# Patient Record
Sex: Male | Born: 1955 | Race: Black or African American | Hispanic: No | Marital: Married | Smoking: Never smoker
Health system: Southern US, Community
[De-identification: ages and names within clinical notes are randomized; demographics above are authoritative.]

## PROBLEM LIST (undated history)

## (undated) DIAGNOSIS — T300 Burn of unspecified body region, unspecified degree: Secondary | ICD-10-CM

## (undated) DIAGNOSIS — E785 Hyperlipidemia, unspecified: Secondary | ICD-10-CM

## (undated) DIAGNOSIS — M109 Gout, unspecified: Secondary | ICD-10-CM

## (undated) DIAGNOSIS — I10 Essential (primary) hypertension: Secondary | ICD-10-CM

## (undated) HISTORY — DX: Hyperlipidemia, unspecified: E78.5

## (undated) HISTORY — DX: Essential (primary) hypertension: I10

## (undated) HISTORY — DX: Burn of unspecified body region, unspecified degree: T30.0

## (undated) HISTORY — DX: Gout, unspecified: M10.9

## (undated) HISTORY — PX: NO PAST SURGERIES: SHX2092

---

## 2001-07-16 ENCOUNTER — Emergency Department (HOSPITAL_COMMUNITY): Admission: EM | Admit: 2001-07-16 | Discharge: 2001-07-17 | Payer: Self-pay | Admitting: Emergency Medicine

## 2009-05-29 LAB — HM COLONOSCOPY

## 2012-01-16 ENCOUNTER — Ambulatory Visit (INDEPENDENT_AMBULATORY_CARE_PROVIDER_SITE_OTHER): Payer: PRIVATE HEALTH INSURANCE | Admitting: Family Medicine

## 2012-01-16 DIAGNOSIS — Z2839 Other underimmunization status: Secondary | ICD-10-CM

## 2012-01-16 DIAGNOSIS — Z0000 Encounter for general adult medical examination without abnormal findings: Secondary | ICD-10-CM

## 2012-01-16 DIAGNOSIS — Z283 Underimmunization status: Secondary | ICD-10-CM

## 2012-01-16 DIAGNOSIS — Z23 Encounter for immunization: Secondary | ICD-10-CM

## 2012-01-16 NOTE — Progress Notes (Signed)
  Subjective:    Patient ID: Brandon Sandoval, male    DOB: [birth year], 56 y.o.   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  H/O HTN and HLD.  Well-controlled.  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   No concerns.  Doing well.  Declines bloodwork today.  Due in next 2 months.    Review of Systems Negative except as per HPI     Objective:   Physical Exam  Constitutional: Vital signs are normal. He appears well-developed and well-nourished. He is active.  HENT:  Head: Normocephalic.  Right Ear: Hearing, tympanic membrane, external ear and ear canal normal.  Left Ear: Hearing, tympanic membrane, external ear and ear canal normal.  Nose: Nose normal.  Mouth/Throat: Uvula is midline, oropharynx is clear and moist and mucous membranes are normal.  Eyes: Conjunctivae and EOM are normal. Pupils are equal, round, and reactive to light.  Neck: No mass and no thyromegaly present.  Cardiovascular: Normal rate, regular rhythm, normal heart sounds, intact distal pulses and normal pulses.   Pulmonary/Chest: Effort normal and breath sounds normal.  Abdominal: Soft. Normal appearance and bowel sounds are normal. There is no hepatosplenomegaly. There is no tenderness. There is no CVA tenderness. No hernia. Hernia confirmed negative in the right inguinal area and confirmed negative in the left inguinal area.  Genitourinary: Testes normal and penis normal.  Lymphadenopathy:    He has no cervical adenopathy.    He has no axillary adenopathy.  Neurological: He is alert.          Assessment & Plan:  PE - normal.  Forms completed.  tdap updated.

## 2012-01-30 ENCOUNTER — Other Ambulatory Visit: Payer: Self-pay | Admitting: Family Medicine

## 2012-01-30 MED ORDER — LISINOPRIL-HYDROCHLOROTHIAZIDE 20-12.5 MG PO TABS: 1.0000 | ORAL_TABLET | Freq: Every day | ORAL | Status: DC

## 2012-02-28 ENCOUNTER — Other Ambulatory Visit: Payer: Self-pay | Admitting: Internal Medicine

## 2012-03-02 ENCOUNTER — Ambulatory Visit (INDEPENDENT_AMBULATORY_CARE_PROVIDER_SITE_OTHER): Payer: PRIVATE HEALTH INSURANCE | Admitting: Physician Assistant

## 2012-03-02 VITALS — BP 119/76 | HR 76 | Temp 98.4°F | Resp 16 | Ht 71.5 in | Wt 230.2 lb

## 2012-03-02 DIAGNOSIS — I10 Essential (primary) hypertension: Secondary | ICD-10-CM | POA: Insufficient documentation

## 2012-03-02 DIAGNOSIS — E7800 Pure hypercholesterolemia, unspecified: Secondary | ICD-10-CM

## 2012-03-02 DIAGNOSIS — E782 Mixed hyperlipidemia: Secondary | ICD-10-CM | POA: Insufficient documentation

## 2012-03-02 LAB — LIPID PANEL
Cholesterol: 187 mg/dL (ref 0–200)
Triglycerides: 154 mg/dL — ABNORMAL HIGH (ref <150)

## 2012-03-02 LAB — COMPREHENSIVE METABOLIC PANEL
ALT: 28 U/L (ref 0–53)
Albumin: 4.8 g/dL (ref 3.5–5.2)
CO2: 27 mEq/L (ref 19–32)
Chloride: 105 mEq/L (ref 96–112)
Glucose, Bld: 93 mg/dL (ref 70–99)
Potassium: 4.1 mEq/L (ref 3.5–5.3)
Sodium: 141 mEq/L (ref 135–145)
Total Protein: 8 g/dL (ref 6.0–8.3)

## 2012-03-02 MED ORDER — PRAVASTATIN SODIUM 40 MG PO TABS: 40.0000 mg | ORAL_TABLET | Freq: Every day | ORAL | Status: DC

## 2012-03-02 MED ORDER — LISINOPRIL-HYDROCHLOROTHIAZIDE 20-12.5 MG PO TABS: 1.0000 | ORAL_TABLET | Freq: Every day | ORAL | Status: DC

## 2012-03-02 NOTE — Progress Notes (Signed)
  Subjective:    Patient ID: Brandon Sandoval, male    DOB: 1956-03-02, 56 y.o.   MRN: [id number]  HPI  Pt presents to clinic for medication refill.  He needs his BP and cholesterol medications.  He does not check his BP at home.  He feels good, no CP no SOB.  No problems with his medications.  Review of Systems  Respiratory: Negative for shortness of breath.   Cardiovascular: Negative for chest pain.  Gastrointestinal: Negative for nausea.  Musculoskeletal:       No LE edema.        Objective:   Physical Exam  Constitutional: He is oriented to person, place, and time. He appears well-developed and well-nourished.  HENT:  Head: Normocephalic and atraumatic.  Right Ear: External ear normal.  Left Ear: External ear normal.  Eyes: Conjunctivae are normal.  Cardiovascular: Normal rate, regular rhythm and normal heart sounds.   Pulmonary/Chest: Effort normal and breath sounds normal.  Musculoskeletal: Normal range of motion.  Neurological: He is alert and oriented to person, place, and time.  Skin: Skin is warm and dry.  Psychiatric: He has a normal mood and affect. His behavior is normal. Judgment and thought content normal.          Assessment & Plan:   1. HTN (hypertension)  Comprehensive metabolic panel, Lipid panel, lisinopril-hydrochlorothiazide (PRINZIDE,ZESTORETIC) 20-12.5 MG per tablet  2. Hypercholesteremia  Comprehensive metabolic panel, Lipid panel, pravastatin (PRAVACHOL) 40 MG tablet   Will plan on recheck in 6 months unless cholesterol labs return abnl.  Sent in medications for 6 months.

## 2012-08-31 ENCOUNTER — Other Ambulatory Visit: Payer: Self-pay | Admitting: Physician Assistant

## 2012-09-28 ENCOUNTER — Other Ambulatory Visit: Payer: Self-pay | Admitting: Physician Assistant

## 2012-10-01 ENCOUNTER — Ambulatory Visit (INDEPENDENT_AMBULATORY_CARE_PROVIDER_SITE_OTHER): Payer: PRIVATE HEALTH INSURANCE | Admitting: Family Medicine

## 2012-10-01 VITALS — BP 138/84 | HR 95 | Temp 99.4°F | Resp 17 | Ht 70.5 in | Wt 231.0 lb

## 2012-10-01 DIAGNOSIS — I10 Essential (primary) hypertension: Secondary | ICD-10-CM

## 2012-10-01 DIAGNOSIS — E785 Hyperlipidemia, unspecified: Secondary | ICD-10-CM

## 2012-10-01 LAB — COMPREHENSIVE METABOLIC PANEL WITH GFR
ALT: 20 U/L (ref 0–53)
AST: 16 U/L (ref 0–37)
Albumin: 3.9 g/dL (ref 3.5–5.2)
CO2: 28 meq/L (ref 19–32)
Calcium: 9.5 mg/dL (ref 8.4–10.5)
Chloride: 106 meq/L (ref 96–112)
Creat: 1.38 mg/dL — ABNORMAL HIGH (ref 0.50–1.35)
Potassium: 4.1 meq/L (ref 3.5–5.3)
Sodium: 143 meq/L (ref 135–145)
Total Protein: 7.5 g/dL (ref 6.0–8.3)

## 2012-10-01 LAB — COMPREHENSIVE METABOLIC PANEL
Alkaline Phosphatase: 68 U/L (ref 39–117)
BUN: 12 mg/dL (ref 6–23)
Glucose, Bld: 80 mg/dL (ref 70–99)
Total Bilirubin: 0.4 mg/dL (ref 0.3–1.2)

## 2012-10-01 LAB — TSH: TSH: 0.522 u[IU]/mL (ref 0.350–4.500)

## 2012-10-01 MED ORDER — LISINOPRIL-HYDROCHLOROTHIAZIDE 20-12.5 MG PO TABS: 1.0000 | ORAL_TABLET | Freq: Every day | ORAL | Status: DC

## 2012-10-01 MED ORDER — PRAVASTATIN SODIUM 40 MG PO TABS: 40.0000 mg | ORAL_TABLET | Freq: Every evening | ORAL | Status: DC

## 2012-10-01 NOTE — Progress Notes (Signed)
[facility]:  Office Visit  Chief Complaint:  Chief Complaint  Patient presents with  . Medication Refill    HPI: Brandon Sandoval is a 56 y.o. male who complains of : 1. HTN-takes meds regular. No SEs. Does not measure BP at home.  2. Hyperlipidemia-takes meds regular. No Ses 3. Vitamin B12 and D deficiency?  OTC meds. Wants to know if he needs to take them.  Past Medical History  Diagnosis Date  . Hyperlipidemia   . Hypertension    History reviewed. No pertinent past surgical history. History   Social History  . Marital Status: Married    Spouse Name: N/A    Number of Children: N/A  . Years of Education: N/A   Social History Main Topics  . Smoking status: Never Smoker   . Smokeless tobacco: None  . Alcohol Use: No  . Drug Use: No  . Sexually Active: None   Other Topics Concern  . None   Social History Narrative  . None   Family History  Problem Relation Age of Onset  . Diabetes Mother   . Heart disease Mother   . Heart disease Father    No Known Allergies Prior to Admission medications   Medication Sig Start Date End Date Taking? Authorizing Provider  aspirin 81 MG tablet Take 81 mg by mouth daily.   Yes Historical Provider, MD  Cholecalciferol (VITAMIN D) 2000 UNITS CAPS Take 2,000 Units by mouth daily.   Yes Historical Provider, MD  lisinopril-hydrochlorothiazide (PRINZIDE,ZESTORETIC) 20-12.5 MG per tablet TAKE 1 TABLET ONCE DAILY. 08/31/12  Yes Brandon M Marte, PA-C  pravastatin (PRAVACHOL) 40 MG tablet Take 1 tablet (40 mg total) by mouth every evening. Need office visit for additional refills. Second Notice. 09/28/12  Yes Brandon S Jeffery, PA-C  Thiamine HCl (VITAMIN B-1 PO) Take by mouth.   Yes Historical Provider, MD     ROS: The patient denies fevers, chills, night sweats, unintentional weight loss, chest pain, palpitations, wheezing, dyspnea on exertion, nausea, vomiting, abdominal pain, dysuria, hematuria, melena, numbness,  weakness, or tingling.   All other systems have been reviewed and were otherwise negative with the exception of those mentioned in the HPI and as above.    PHYSICAL EXAM: Filed Vitals:   10/01/12 1606  BP: 138/84  Pulse: 95  Temp: 99.4 F (37.4 C)  Resp: 17   Filed Vitals:   10/01/12 1606  Height: 5' 10.5" (1.791 m)  Weight: 231 lb (104.781 kg)   Body mass index is 32.68 kg/(m^2).  General: Alert, no acute distress HEENT:  Normocephalic, atraumatic, oropharynx patent. EOMI, PERRLA, gross fundoscopic exam nl. Cardiovascular:  Regular rate and rhythm, no rubs murmurs or gallops.  No Carotid bruits, radial pulse intact. No pedal edema.  Respiratory: Clear to auscultation bilaterally.  No wheezes, rales, or rhonchi.  No cyanosis, no use of accessory musculature GI: No organomegaly, abdomen is soft and non-tender, positive bowel sounds.  No masses. Skin: No rashes. Neurologic: Facial musculature symmetric. Psychiatric: Patient is appropriate throughout our interaction. Lymphatic: No cervical lymphadenopathy Musculoskeletal: Gait intact.   LABS: Results for orders placed in visit on 03/02/12  COMPREHENSIVE METABOLIC PANEL      Component Value Range   Sodium 141  135 - 145 mEq/L   Potassium 4.1  3.5 - 5.3 mEq/L   Chloride 105  96 - 112 mEq/L   CO2 27  19 - 32 mEq/L   Glucose, Bld 93  70 - 99 mg/dL  BUN 14  6 - 23 mg/dL   Creat 4.09  8.11 - 9.14 mg/dL   Total Bilirubin 0.6  0.3 - 1.2 mg/dL   Alkaline Phosphatase 63  39 - 117 U/L   AST 19  0 - 37 U/L   ALT 28  0 - 53 U/L   Total Protein 8.0  6.0 - 8.3 g/dL   Albumin 4.8  3.5 - 5.2 g/dL   Calcium 9.4  8.4 - 78.2 mg/dL  LIPID PANEL      Component Value Range   Cholesterol 187  0 - 200 mg/dL   Triglycerides 956 (*) <150 mg/dL   HDL 42  >21 mg/dL   Total CHOL/HDL Ratio 4.5     VLDL 31  0 - 40 mg/dL   LDL Cholesterol 308 (*) 0 - 99 mg/dL     EKG/XRAY:   Primary read interpreted by Dr. Conley Rolls at  [facility].   ASSESSMENT/PLAN: Encounter Diagnoses  Name Primary?  . HTN (hypertension) Yes  . Hyperlipidemia    Patient is compliant with meds. He is uninsured. He does not come in to get his labs done every  Months as requested. Today he is not fasting. I am getting just a CMP and have asked him to return in 6 months to get his fasting blood work done so we can get both a CMP and lipid panel. Labs pending. Continue with ASA 81 mg daily. F/u in 6 months Refilled meds.   Brandon Capri PHUONG, DO 10/01/2012 4:45 PM

## 2012-10-07 ENCOUNTER — Other Ambulatory Visit: Payer: Self-pay | Admitting: Physician Assistant

## 2012-10-09 ENCOUNTER — Encounter: Payer: Self-pay | Admitting: Family Medicine

## 2012-10-21 ENCOUNTER — Ambulatory Visit (INDEPENDENT_AMBULATORY_CARE_PROVIDER_SITE_OTHER): Payer: PRIVATE HEALTH INSURANCE | Admitting: Family Medicine

## 2012-10-21 VITALS — BP 104/72 | HR 77 | Temp 98.2°F | Resp 18 | Wt 230.0 lb

## 2012-10-21 DIAGNOSIS — M25476 Effusion, unspecified foot: Secondary | ICD-10-CM

## 2012-10-21 DIAGNOSIS — M25579 Pain in unspecified ankle and joints of unspecified foot: Secondary | ICD-10-CM

## 2012-10-21 DIAGNOSIS — M25473 Effusion, unspecified ankle: Secondary | ICD-10-CM

## 2012-10-21 LAB — URIC ACID: Uric Acid, Serum: 7.7 mg/dL (ref 4.0–7.8)

## 2012-10-21 MED ORDER — COLCHICINE 0.6 MG PO TABS: 0.6000 mg | ORAL_TABLET | Freq: Every day | ORAL | Status: DC

## 2012-10-21 NOTE — Progress Notes (Signed)
[facility]:  Office Visit  Chief Complaint:  Chief Complaint  Patient presents with  . Ankle Pain    left    HPI: Brandon Sandoval is a 56 y.o. male who complains of  Left ankle swelling x 3 days. Painful, swollen, hard to walk on. NKI. Has tried epson salts without relief. Has had episode of what sounds like the gout before. Denies fevers, chills. Increase shrimp and meat intake last week. No numbness, tingling.  Past Medical History  Diagnosis Date  . Hyperlipidemia   . Hypertension    History reviewed. No pertinent past surgical history. History   Social History  . Marital Status: Married    Spouse Name: N/A    Number of Children: N/A  . Years of Education: N/A   Social History Main Topics  . Smoking status: Never Smoker   . Smokeless tobacco: None  . Alcohol Use: No  . Drug Use: No  . Sexually Active: None   Other Topics Concern  . None   Social History Narrative  . None   Family History  Problem Relation Age of Onset  . Diabetes Mother   . Heart disease Mother   . Heart disease Father    No Known Allergies Prior to Admission medications   Medication Sig Start Date End Date Taking? Authorizing Provider  aspirin 81 MG tablet Take 81 mg by mouth daily.   Yes Historical Provider, MD  lisinopril-hydrochlorothiazide (PRINZIDE,ZESTORETIC) 20-12.5 MG per tablet Take 1 tablet by mouth daily. 10/01/12  Yes Samaira Holzworth P Darlean Warmoth, DO  pravastatin (PRAVACHOL) 40 MG tablet Take 1 tablet (40 mg total) by mouth every evening. 10/01/12  Yes Yan Pankratz P Ratasha Fabre, DO  Cholecalciferol (VITAMIN D) 2000 UNITS CAPS Take 2,000 Units by mouth daily.    Historical Provider, MD  Thiamine HCl (VITAMIN B-1 PO) Take by mouth.    Historical Provider, MD     ROS: The patient denies fevers, chills, night sweats, unintentional weight loss, chest pain, palpitations, wheezing, dyspnea on exertion, nausea, vomiting, abdominal pain, dysuria, hematuria, melena, numbness, weakness, or tingling.    All other systems have been reviewed and were otherwise negative with the exception of those mentioned in the HPI and as above.    PHYSICAL EXAM: Filed Vitals:   10/21/12 1054  BP: 104/72  Pulse: 77  Temp: 98.2 F (36.8 C)  Resp: 18   Filed Vitals:   10/21/12 1054  Weight: 230 lb (104.327 kg)   There is no height on file to calculate BMI.  General: Alert, no acute distress HEENT:  Normocephalic, atraumatic, oropharynx patent.  Cardiovascular:  Regular rate and rhythm, no rubs murmurs or gallops.  No Carotid bruits, radial pulse intact. No pedal edema.  Respiratory: Clear to auscultation bilaterally.  No wheezes, rales, or rhonchi.  No cyanosis, no use of accessory musculature GI: No organomegaly, abdomen is soft and non-tender, positive bowel sounds.  No masses. Skin: No rashes. Neurologic: Facial musculature symmetric. Psychiatric: Patient is appropriate throughout our interaction. Lymphatic: No cervical lymphadenopathy Musculoskeletal: Gait limping Left ankle swelling-mild warmth, no erythema. ROM and sensation intact. + DP. 5/5 strength.Tender around ankle with walking   LABS: Results for orders placed in visit on 10/01/12  COMPREHENSIVE METABOLIC PANEL      Component Value Range   Sodium 143  135 - 145 mEq/L   Potassium 4.1  3.5 - 5.3 mEq/L   Chloride 106  96 - 112 mEq/L   CO2 28  19 -  32 mEq/L   Glucose, Bld 80  70 - 99 mg/dL   BUN 12  6 - 23 mg/dL   Creat 1.61 (*) 0.96 - 1.35 mg/dL   Total Bilirubin 0.4  0.3 - 1.2 mg/dL   Alkaline Phosphatase 68  39 - 117 U/L   AST 16  0 - 37 U/L   ALT 20  0 - 53 U/L   Total Protein 7.5  6.0 - 8.3 g/dL   Albumin 3.9  3.5 - 5.2 g/dL   Calcium 9.5  8.4 - 04.5 mg/dL  TSH      Component Value Range   TSH 0.522  0.350 - 4.500 uIU/mL     EKG/XRAY:   Primary read interpreted by Dr. Conley Rolls at [facility].   ASSESSMENT/PLAN: Encounter Diagnoses  Name Primary?  Marland Kitchen Ankle pain Yes  . Ankle swelling     ? Gout vs arthritis Will rx  Colcrys, if too expensive then will rx Indomethacine x 5 days.  Uric Acid pending F/u prn   Sundee Garland PHUONG, DO 10/21/2012 11:14 AM

## 2012-10-24 ENCOUNTER — Telehealth: Payer: Self-pay | Admitting: Family Medicine

## 2012-10-24 NOTE — Telephone Encounter (Signed)
Did not leave message

## 2012-10-28 ENCOUNTER — Encounter: Payer: Self-pay | Admitting: Family Medicine

## 2012-10-29 ENCOUNTER — Other Ambulatory Visit: Payer: Self-pay | Admitting: *Deleted

## 2012-10-29 DIAGNOSIS — E785 Hyperlipidemia, unspecified: Secondary | ICD-10-CM

## 2012-10-29 MED ORDER — PRAVASTATIN SODIUM 40 MG PO TABS: 40.0000 mg | ORAL_TABLET | Freq: Every evening | ORAL | Status: DC

## 2012-12-13 ENCOUNTER — Ambulatory Visit (INDEPENDENT_AMBULATORY_CARE_PROVIDER_SITE_OTHER): Payer: PRIVATE HEALTH INSURANCE | Admitting: Family Medicine

## 2012-12-13 ENCOUNTER — Ambulatory Visit: Payer: PRIVATE HEALTH INSURANCE

## 2012-12-13 VITALS — BP 129/76 | HR 101 | Temp 98.4°F | Resp 16 | Ht 70.5 in | Wt 230.0 lb

## 2012-12-13 DIAGNOSIS — M25579 Pain in unspecified ankle and joints of unspecified foot: Secondary | ICD-10-CM

## 2012-12-13 DIAGNOSIS — M1990 Unspecified osteoarthritis, unspecified site: Secondary | ICD-10-CM

## 2012-12-13 DIAGNOSIS — M129 Arthropathy, unspecified: Secondary | ICD-10-CM

## 2012-12-13 LAB — POCT CBC
HCT, POC: 41.5 % — AB (ref 43.5–53.7)
Hemoglobin: 12.5 g/dL — AB (ref 14.1–18.1)
Lymph, poc: 2.4 (ref 0.6–3.4)
MCHC: 30.1 g/dL — AB (ref 31.8–35.4)
POC Granulocyte: 8.2 — AB (ref 2–6.9)
RBC: 4.39 M/uL — AB (ref 4.69–6.13)
WBC: 11.2 10*3/uL — AB (ref 4.6–10.2)

## 2012-12-13 LAB — URIC ACID: Uric Acid, Serum: 8.8 mg/dL — ABNORMAL HIGH (ref 4.0–7.8)

## 2012-12-13 MED ORDER — INDOMETHACIN 25 MG PO CAPS: 25.0000 mg | ORAL_CAPSULE | Freq: Three times a day (TID) | ORAL | Status: DC

## 2012-12-13 MED ORDER — METHYLPREDNISOLONE ACETATE 80 MG/ML IJ SUSP
80.0000 mg | Freq: Once | INTRAMUSCULAR | Status: AC
  Administered 2012-12-13: 80 mg via INTRAMUSCULAR

## 2012-12-13 MED ORDER — PREDNISONE 20 MG PO TABS: ORAL_TABLET | ORAL | Status: DC

## 2012-12-13 NOTE — Progress Notes (Signed)
Subjective: Patient is here for a painful right ankle. It's been hurting this week, but yesterday he had to pull out the crutches and start using them he was hurting so much. He's never had to walk on them for this before. He has a history of being evaluated for left ankle pain in August of 2011, at which time his uric acid was 10.3. He's had some chronic pain in that left ankle but this is the first time that the right ankle is flared. He has had some pain in his large toe of his left foot when the uric acid was done in the past. 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 In November when his uric acid level was checked it was only 7.7 with normal up to 7.8, but as noted above 2 and half years ago he had a 10.3 level. The Colcrys did not seem to help him a whole lot this last time.  Objective: Left ankle is little stiff but not particularly painful. Left large toe is not a problem. The right ankle has a little warmth to palpation. 3 tender anteriorly across the joint line. There is some swelling of the foot. He can barely bear weight on it.  Assessment: Acute gouty arthritis Ankle pain  Plan: Recheck a uric acid, sedimentation rate and CBC for baseline. Will get an x-ray of the ankle just to be certain there is nothing new there since he has not had problems with this ankle in the past. Then will probably treat with anti-inflammatory  medication and later on he may need to be on long-term allopurinol.  Results for orders placed in visit on 12/13/12  POCT CBC      Component Value Range   WBC 11.2 (*) 4.6 - 10.2 K/uL   Lymph, poc 2.4  0.6 - 3.4   POC LYMPH PERCENT 21.7  10 - 50 %L   MID (cbc) 0.6  0 - 0.9   POC MID % 5.4  0 - 12 %M   POC Granulocyte 8.2 (*) 2 - 6.9   Granulocyte percent 72.9  37 - 80 %G   RBC 4.39 (*) 4.69 - 6.13 M/uL   Hemoglobin 12.5 (*) 14.1 - 18.1 g/dL   HCT, POC 52.8 (*) 41.3 - 53.7 %   MCV  94.5  80 - 97 fL   MCH, POC 28.5  27 - 31.2 pg   MCHC 30.1 (*) 31.8 - 35.4 g/dL   RDW, POC 24.4     Platelet Count, POC 431 (*) 142 - 424 K/uL   MPV 7.9  0 - 99.8 fL   [facility] reading (PRIMARY) by  Dr. Alwyn Ren Calcaneal spur.  Ankle normal  Will treat for gout.Marland Kitchen

## 2012-12-13 NOTE — Patient Instructions (Addendum)
Gout Gout is an inflammatory condition (arthritis) caused by a buildup of uric acid crystals in the joints. Uric acid is a chemical that is normally present in the blood. Under some circumstances, uric acid can form into crystals in your joints. This causes joint redness, soreness, and swelling (inflammation). Repeat attacks are common. Over time, uric acid crystals can form into masses (tophi) near a joint, causing disfigurement. Gout is treatable and often preventable. CAUSES  The disease begins with elevated levels of uric acid in the blood. Uric acid is produced by your body when it breaks down a naturally found substance called purines. This also happens when you eat certain foods such as meats and fish. Causes of an elevated uric acid level include:  Being passed down from parent to child (heredity).  Diseases that cause increased uric acid production (obesity, psoriasis, some cancers).  Excessive alcohol use.  Diet, especially diets rich in meat and seafood.  Medicines, including certain cancer-fighting drugs (chemotherapy), diuretics, and aspirin.  Chronic kidney disease. The kidneys are no longer able to remove uric acid well.  Problems with metabolism. Conditions strongly associated with gout include:  Obesity.  High blood pressure.  High cholesterol.  Diabetes. Not everyone with elevated uric acid levels gets gout. It is not understood why some people get gout and others do not. Surgery, joint injury, and eating too much of certain foods are some of the factors that can lead to gout. SYMPTOMS   An attack of gout comes on quickly. It causes intense pain with redness, swelling, and warmth in a joint.  Fever can occur.  Often, only one joint is involved. Certain joints are more commonly involved:  Base of the big toe.  Knee.  Ankle.  Wrist.  Finger. Without treatment, an attack usually goes away in a few days to weeks. Between attacks, you usually will not have  symptoms, which is different from many other forms of arthritis. DIAGNOSIS  Your caregiver will suspect gout based on your symptoms and exam. Removal of fluid from the joint (arthrocentesis) is done to check for uric acid crystals. Your caregiver will give you a medicine that numbs the area (local anesthetic) and use a needle to remove joint fluid for exam. Gout is confirmed when uric acid crystals are seen in joint fluid, using a special microscope. Sometimes, blood, urine, and X-ray tests are also used. TREATMENT  There are 2 phases to gout treatment: treating the sudden onset (acute) attack and preventing attacks (prophylaxis). Treatment of an Acute Attack  Medicines are used. These include anti-inflammatory medicines or steroid medicines.  An injection of steroid medicine into the affected joint is sometimes necessary.  The painful joint is rested. Movement can worsen the arthritis.  You may use warm or cold treatments on painful joints, depending which works best for you.  Discuss the use of coffee, vitamin C, or cherries with your caregiver. These may be helpful treatment options. Treatment to Prevent Attacks After the acute attack subsides, your caregiver may advise prophylactic medicine. These medicines either help your kidneys eliminate uric acid from your body or decrease your uric acid production. You may need to stay on these medicines for a very long time. The early phase of treatment with prophylactic medicine can be associated with an increase in acute gout attacks. For this reason, during the first few months of treatment, your caregiver may also advise you to take medicines usually used for acute gout treatment. Be sure you understand your caregiver's directions.   You should also discuss dietary treatment with your caregiver. Certain foods such as meats and fish can increase uric acid levels. Other foods such as dairy can decrease levels. Your caregiver can give you a list of foods  to avoid. HOME CARE INSTRUCTIONS   Do not take aspirin to relieve pain. This raises uric acid levels.  Only take over-the-counter or prescription medicines for pain, discomfort, or fever as directed by your caregiver.  Rest the joint as much as possible. When in bed, keep sheets and blankets off painful areas.  Keep the affected joint raised (elevated).  Use crutches if the painful joint is in your leg.  Drink enough water and fluids to keep your urine clear or pale yellow. This helps your body get rid of uric acid. Do not drink alcoholic beverages. They slow the passage of uric acid.  Follow your caregiver's dietary instructions. Pay careful attention to the amount of protein you eat. Your daily diet should emphasize fruits, vegetables, whole grains, and fat-free or low-fat milk products.  Maintain a healthy body weight. SEEK MEDICAL CARE IF:   You have an oral temperature above 102 F (38.9 C).  You develop diarrhea, vomiting, or any side effects from medicines.  You do not feel better in 24 hours, or you are getting worse. SEEK IMMEDIATE MEDICAL CARE IF:   Your joint becomes suddenly more tender and you have:  Chills.  An oral temperature above 102 F (38.9 C), not controlled by medicine. MAKE SURE YOU:   Understand these instructions.  Will watch your condition.  Will get help right away if you are not doing well or get worse. Document Released: 11/11/2000 Document Revised: 02/06/2012 Document Reviewed: 02/22/2010 [employer] Patient Information 2013 [employer].    

## 2012-12-19 ENCOUNTER — Other Ambulatory Visit: Payer: Self-pay

## 2012-12-19 MED ORDER — ALLOPURINOL 100 MG PO TABS: 100.0000 mg | ORAL_TABLET | Freq: Every day | ORAL | Status: DC

## 2013-03-07 ENCOUNTER — Ambulatory Visit: Payer: Self-pay | Admitting: Internal Medicine

## 2013-03-07 VITALS — BP 128/80 | HR 70 | Temp 98.1°F | Resp 16 | Ht 71.5 in | Wt 235.0 lb

## 2013-03-07 DIAGNOSIS — Z0289 Encounter for other administrative examinations: Secondary | ICD-10-CM

## 2013-03-07 NOTE — Progress Notes (Signed)
  Subjective:    Patient ID: Brandon Sandoval, male    DOB: Jul 30, 1956, 57 y.o.   MRN: [id number]  HPI Doing well, self pay dot bp controlled and followed by dr. Alwyn Ren.   Review of Systems HTN, Liipid disorder.    Objective:   Physical Exam  Constitutional: He is oriented to person, place, and time. He appears well-developed and well-nourished.  HENT:  Right Ear: External ear normal.  Left Ear: External ear normal.  Nose: Nose normal.  Mouth/Throat: Oropharynx is clear and moist.  Eyes: Conjunctivae and EOM are normal. Pupils are equal, round, and reactive to light.  Neck: Normal range of motion. Neck supple. No thyromegaly present.  Cardiovascular: Normal rate, regular rhythm and normal heart sounds.   Pulmonary/Chest: Effort normal and breath sounds normal.  Abdominal: Soft. There is no tenderness.  Genitourinary: Penis normal.  Musculoskeletal: Normal range of motion.  Lymphadenopathy:    He has no cervical adenopathy.  Neurological: He is alert and oriented to person, place, and time. He has normal reflexes. He exhibits normal muscle tone. Coordination normal.  Skin: Skin is warm. No rash noted.  Psychiatric: He has a normal mood and affect.          Assessment & Plan:  1 yr dot

## 2013-03-16 ENCOUNTER — Encounter: Payer: Self-pay | Admitting: Internal Medicine

## 2013-04-22 ENCOUNTER — Other Ambulatory Visit: Payer: Self-pay

## 2013-04-22 MED ORDER — ALLOPURINOL 100 MG PO TABS: 100.0000 mg | ORAL_TABLET | Freq: Every day | ORAL | Status: DC

## 2013-05-02 ENCOUNTER — Ambulatory Visit (INDEPENDENT_AMBULATORY_CARE_PROVIDER_SITE_OTHER): Payer: PRIVATE HEALTH INSURANCE | Admitting: Family Medicine

## 2013-05-02 ENCOUNTER — Encounter: Payer: Self-pay | Admitting: Family Medicine

## 2013-05-02 VITALS — BP 114/81 | HR 89 | Temp 98.8°F | Ht 71.0 in | Wt 231.6 lb

## 2013-05-02 DIAGNOSIS — E785 Hyperlipidemia, unspecified: Secondary | ICD-10-CM

## 2013-05-02 DIAGNOSIS — I10 Essential (primary) hypertension: Secondary | ICD-10-CM

## 2013-05-02 DIAGNOSIS — M109 Gout, unspecified: Secondary | ICD-10-CM

## 2013-05-02 LAB — LIPID PANEL
LDL Cholesterol: 95 mg/dL (ref 0–99)
Total CHOL/HDL Ratio: 4.9 Ratio
VLDL: 44 mg/dL — ABNORMAL HIGH (ref 0–40)

## 2013-05-02 LAB — POCT CBC
Hemoglobin: 13.3 g/dL — AB (ref 14.1–18.1)
Lymph, poc: 2.2 (ref 0.6–3.4)
MCH, POC: 30.8 pg (ref 27–31.2)
MCHC: 31.1 g/dL — AB (ref 31.8–35.4)
MID (cbc): 0.5 (ref 0–0.9)
MPV: 8.6 fL (ref 0–99.8)
POC Granulocyte: 3.2 (ref 2–6.9)
POC MID %: 8.3 %M (ref 0–12)
Platelet Count, POC: 281 10*3/uL (ref 142–424)
WBC: 5.9 10*3/uL (ref 4.6–10.2)

## 2013-05-02 LAB — COMPREHENSIVE METABOLIC PANEL
ALT: 23 U/L (ref 0–53)
AST: 20 U/L (ref 0–37)
Alkaline Phosphatase: 66 U/L (ref 39–117)
CO2: 24 mEq/L (ref 19–32)
Creat: 1.2 mg/dL (ref 0.50–1.35)
Sodium: 141 mEq/L (ref 135–145)
Total Bilirubin: 0.5 mg/dL (ref 0.3–1.2)
Total Protein: 7.4 g/dL (ref 6.0–8.3)

## 2013-05-02 MED ORDER — LISINOPRIL-HYDROCHLOROTHIAZIDE 20-12.5 MG PO TABS: 1.0000 | ORAL_TABLET | Freq: Every day | ORAL | Status: DC

## 2013-05-02 MED ORDER — ALLOPURINOL 100 MG PO TABS: 100.0000 mg | ORAL_TABLET | Freq: Every day | ORAL | Status: DC

## 2013-05-02 MED ORDER — PRAVASTATIN SODIUM 40 MG PO TABS: 40.0000 mg | ORAL_TABLET | Freq: Every evening | ORAL | Status: DC

## 2013-05-02 NOTE — Progress Notes (Signed)
[facility] [address] [place] [address] [phone]  Date:  05/02/2013   Name:  Brandon Sandoval   DOB:  May 29, 1956   MRN:  [id number]  PCP:  Default, Provider, MD    Chief Complaint: Medication Refill   History of Present Illness:  Brandon Sandoval is a 57 y.o. very pleasant male patient who presents with the following:  He has a history of HTN, gout and high cholesterol. He needs RF of his medications today.   He is fasting today.  He does not check his BP at home. He does not take colchicine regularly.   No other concerns today.  He is otherwise doing well.  His gout is not problematic He has never been a smoker  Patient Active Problem List   Diagnosis Date Noted  . Gout   . HTN (hypertension) 03/02/2012  . Hypercholesteremia 03/02/2012    Past Medical History  Diagnosis Date  . Hyperlipidemia   . Hypertension   . Gout     History reviewed. No pertinent past surgical history.  History  Substance Use Topics  . Smoking status: Never Smoker   . Smokeless tobacco: Not on file  . Alcohol Use: No    Family History  Problem Relation Age of Onset  . Diabetes Mother   . Heart disease Mother   . Heart disease Father   . Heart disease Brother     No Known Allergies  Medication list has been reviewed and updated.  Current Outpatient Prescriptions on File Prior to Visit  Medication Sig Dispense Refill  . allopurinol (ZYLOPRIM) 100 MG tablet Take 1 tablet (100 mg total) by mouth daily.  30 tablet  1  . aspirin 81 MG tablet Take 81 mg by mouth daily.      Marland Kitchen lisinopril-hydrochlorothiazide (PRINZIDE,ZESTORETIC) 20-12.5 MG per tablet Take 1 tablet by mouth daily.  30 tablet  6  . pravastatin (PRAVACHOL) 40 MG tablet Take 1 tablet (40 mg total) by mouth every evening.  30 tablet  5  . Cholecalciferol (VITAMIN D) 2000 UNITS CAPS Take 2,000 Units by mouth daily.      . colchicine 0.6 MG tablet Take 1 tablet (0.6 mg total) by mouth daily.  30 tablet   0  . indomethacin (INDOCIN) 25 MG capsule Take 1 capsule (25 mg total) by mouth 3 (three) times daily with meals.  30 capsule  0  . predniSONE (DELTASONE) 20 MG tablet Take 3 for 2 days, then 2 daily for 2 days, then one daily for 2 days  12 tablet  0  . Thiamine HCl (VITAMIN B-1 PO) Take by mouth.       No current facility-administered medications on file prior to visit.    Review of Systems:  As per HPI- otherwise negative.   Physical Examination: Filed Vitals:   05/02/13 0858  BP: 114/81  Pulse: 89  Temp: 98.8 F (37.1 C)   Filed Vitals:   05/02/13 0858  Height: 5\' 11"  (1.803 m)  Weight: 231 lb 9.6 oz (105.053 kg)   Body mass index is 32.32 kg/(m^2). Ideal Body Weight: Weight in (lb) to have BMI = 25: 178.9  GEN: WDWN, NAD, Non-toxic, A & O x 3, overweight HEENT: Atraumatic, Normocephalic. Neck supple. No masses, No LAD. Ears and Nose: No external deformity. CV: RRR, No M/G/R. No JVD. No thrill. No extra heart sounds. PULM: CTA B, no wheezes, crackles, rhonchi. No retractions. No resp. distress. No accessory  muscle use. ABD: S, NT, ND, +BS. No rebound. No HSM. EXTR: No c/c/e NEURO Normal gait.  PSYCH: Normally interactive. Conversant. Not depressed or anxious appearing.  Calm demeanor.    Assessment and Plan: HTN (hypertension) - Plan: lisinopril-hydrochlorothiazide (PRINZIDE,ZESTORETIC) 20-12.5 MG per tablet, POCT CBC, Comprehensive metabolic panel  Hyperlipidemia - Plan: pravastatin (PRAVACHOL) 40 MG tablet, Lipid panel  Gout - Plan: allopurinol (ZYLOPRIM) 100 MG tablet  Refilled medicines today.  Labs pending. Declined a PSA today.   Follow- up pending labs.   Signed Abbe Amsterdam, MD

## 2013-05-02 NOTE — Patient Instructions (Addendum)
I will be in touch with your labs.  Take care!  

## 2013-09-07 IMAGING — CR DG ANKLE COMPLETE 3+V*R*
3 series · 3 of 3 positions shown · non-contrast
Comparison: None.

CLINICAL DATA: Pain and swelling

RIGHT ANKLE - COMPLETE 3+ VIEW

[AP]
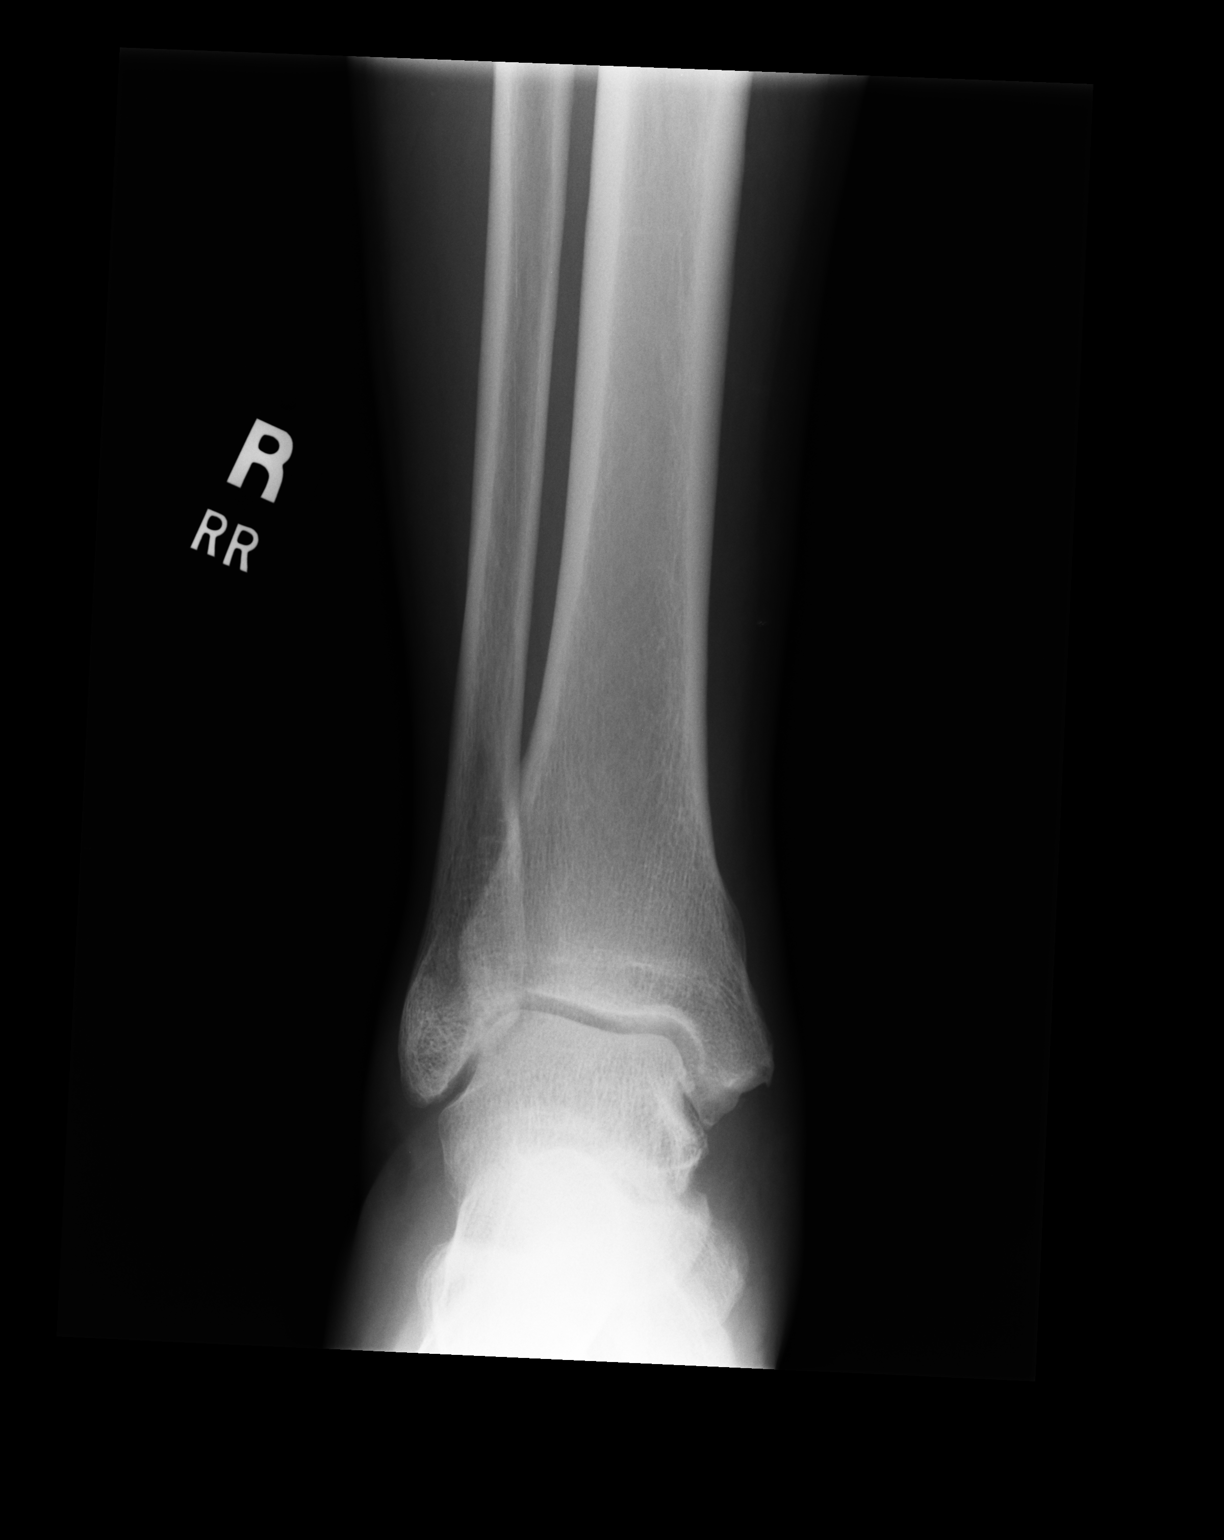

[ap obl int rot]
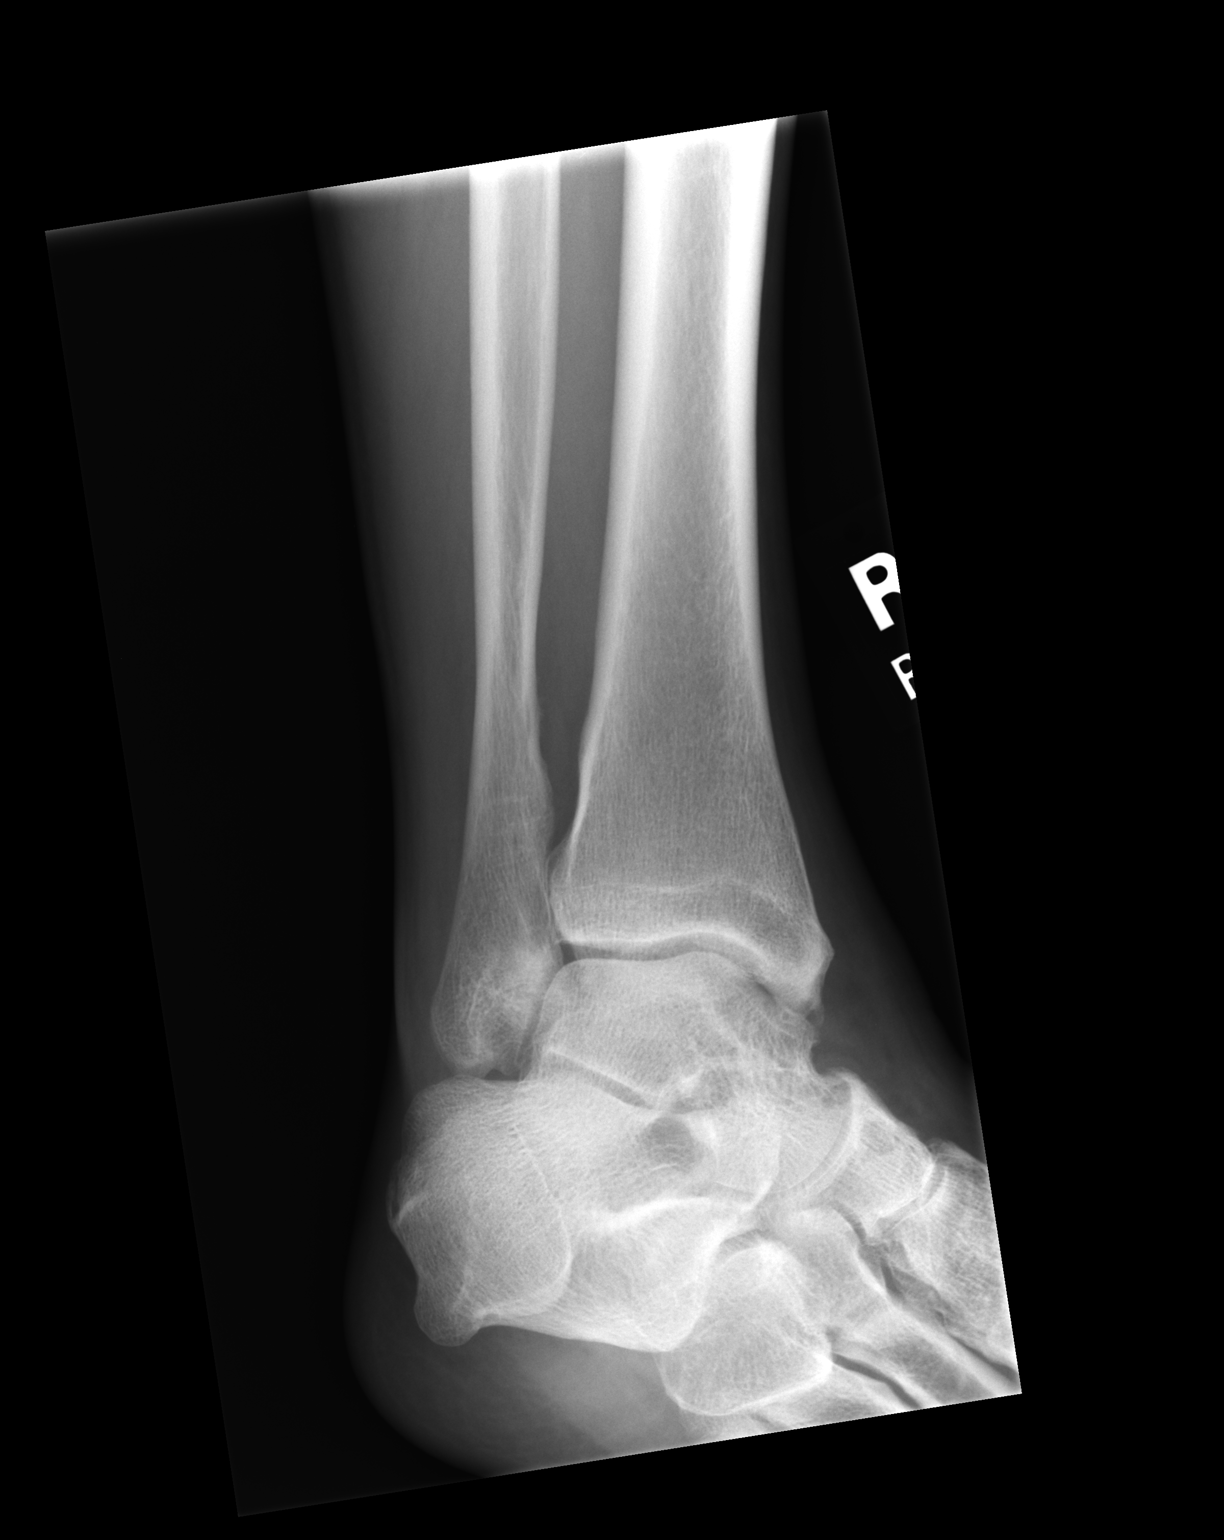

[lateral]
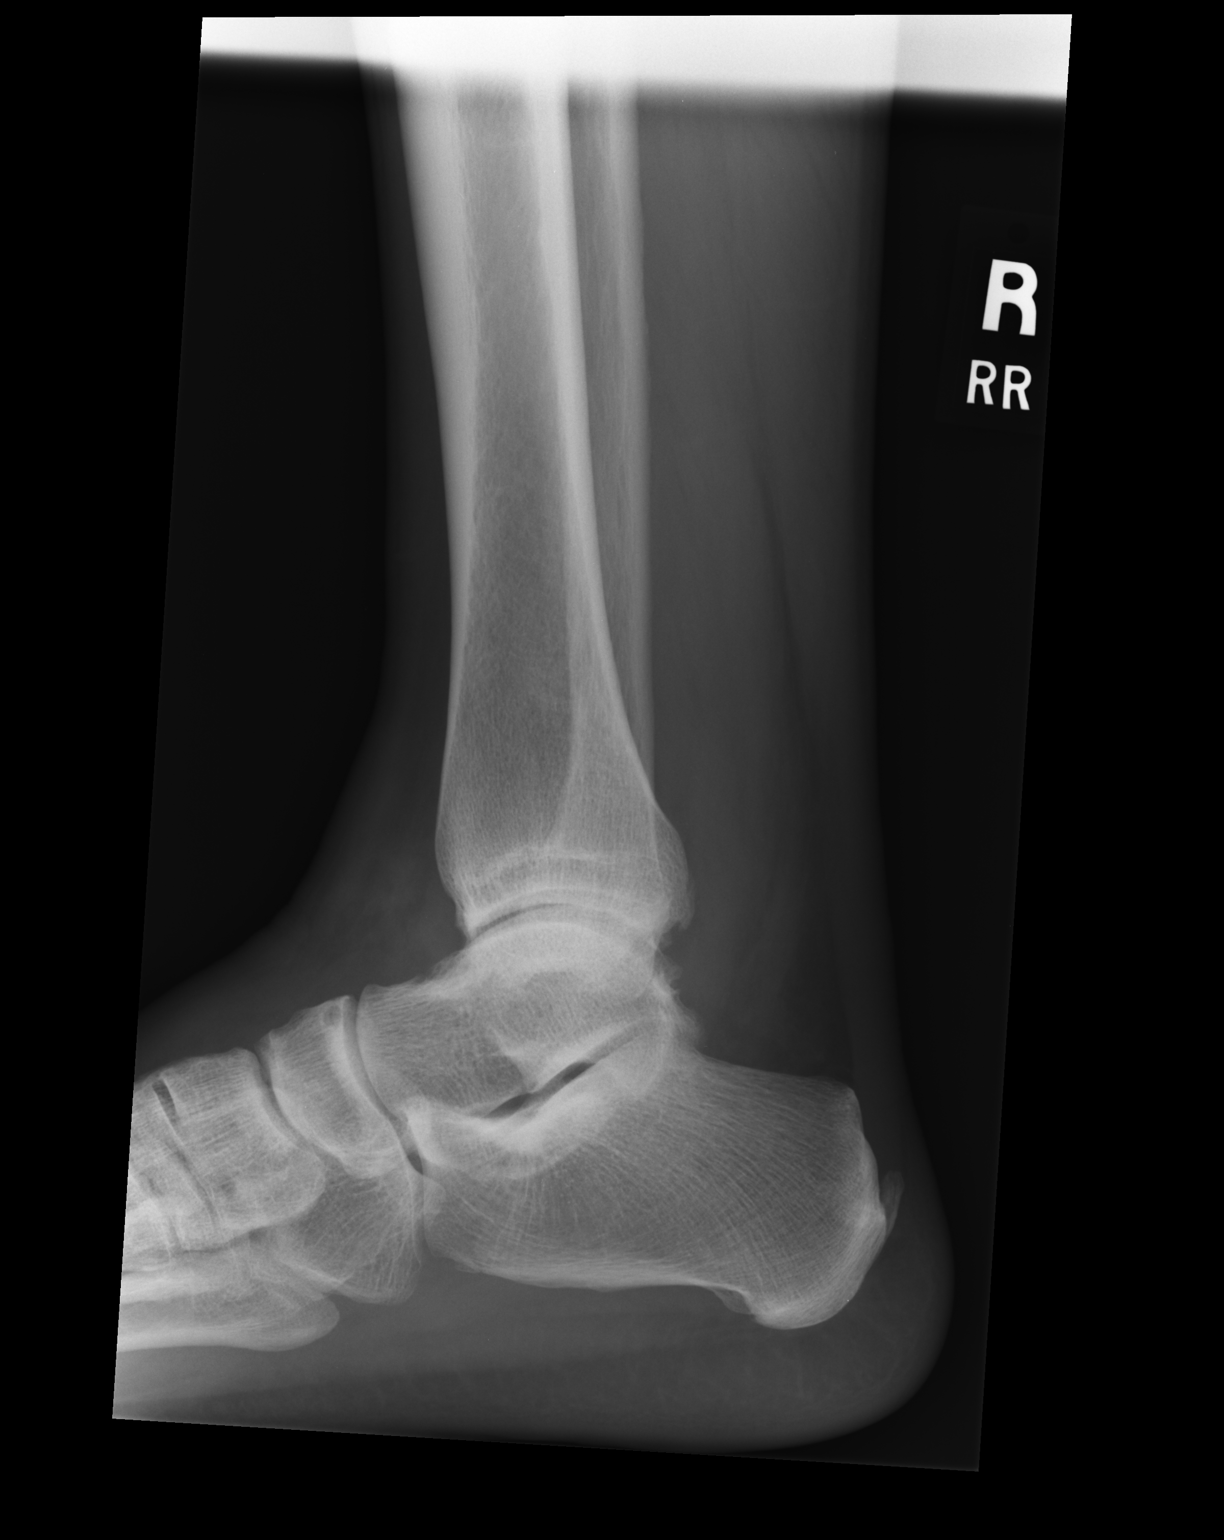

[3 of 3 positions shown; findings below may reference images not displayed]

FINDINGS: Three views of the right ankle submitted.  No acute
fracture or subluxation.  Ankle mortise is preserved.  Posterior
spurring of the calcaneus.
IMPRESSION: No acute fracture or subluxation.  Posterior spurring of the
calcaneus.

## 2014-02-08 ENCOUNTER — Ambulatory Visit: Payer: PRIVATE HEALTH INSURANCE | Admitting: Emergency Medicine

## 2014-02-08 VITALS — BP 104/76 | HR 81 | Temp 98.8°F | Resp 16 | Ht 71.0 in | Wt 240.0 lb

## 2014-02-08 DIAGNOSIS — Z0289 Encounter for other administrative examinations: Secondary | ICD-10-CM

## 2014-02-08 NOTE — Progress Notes (Signed)
   Subjective:    Patient ID: Brandon Sandoval, male    DOB: 03/14/1956, 58 y.o.   MRN: [id number] This chart was scribed for Lesle ChrisSteven Mckennah Kretchmer, MD by Nicholos Johnsenise Iheanachor, Medical Scribe. This patient's care was started at 12:42 PM.  HPI HPI Comments: Brandon Sandoval is a 58 y.o. male who [facility]. Pt is on Lisinopril for HTN and Pravastatin for high cholesterol. Also takes Aspirin regularly. Does not have a regular PCP, states he has [facility] and medication management for years. Otherwise healthy. Denies any other medical problems or injuries.   Review of Systems  Constitutional: Negative for fatigue and unexpected weight change.  Eyes: Negative for visual disturbance.  Respiratory: Negative for cough, chest tightness and shortness of breath.   Cardiovascular: Negative for chest pain, palpitations and leg swelling.  Gastrointestinal: Negative for abdominal pain and blood in stool.  Neurological: Negative for dizziness, light-headedness and headaches.   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 His blood pressure is under good control. He is also on cholesterol medication. I encouraged him to keep his followup appointments for regular monitoring of his medications  I personally performed the services described in this documentation, which was  scribed in my presence. The recorded information has been reviewed and is accurate.

## 2014-02-28 ENCOUNTER — Ambulatory Visit: Payer: PRIVATE HEALTH INSURANCE | Admitting: Internal Medicine

## 2014-02-28 VITALS — BP 124/80 | HR 72 | Temp 98.0°F | Resp 16 | Ht 71.5 in | Wt 238.0 lb

## 2014-02-28 DIAGNOSIS — Z0289 Encounter for other administrative examinations: Secondary | ICD-10-CM

## 2014-02-28 DIAGNOSIS — Z719 Counseling, unspecified: Secondary | ICD-10-CM

## 2014-02-28 NOTE — Progress Notes (Signed)
   Subjective:    Patient ID: Brandon Sandoval, male    DOB: 05/19/1956, 58 y.o.   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 Pt is good health. He does however have hypertension well controled he is taking lisinopril/hctz 20-12.5. With no side effects.   He had complete physical, med rev, lab testing all normal 2 weeks ago.  Review of Systems normal    Objective:   Physical Exam  Constitutional: He is oriented to person, place, and time. He appears well-developed and well-nourished.  HENT:  Head: Normocephalic.  Right Ear: External ear normal.  Left Ear: External ear normal.  Eyes: Conjunctivae and EOM are normal. Pupils are equal, round, and reactive to light.  Neck: Normal range of motion. Neck supple.  Cardiovascular: Normal rate, regular rhythm and normal heart sounds.   Pulmonary/Chest: Effort normal and breath sounds normal.  Abdominal: Soft.  Musculoskeletal: Normal range of motion.  Neurological: He is alert and oriented to person, place, and time. No cranial nerve deficit. He exhibits normal muscle tone. Coordination normal.  Psychiatric: He has a normal mood and affect. His behavior is normal. Judgment normal.          Assessment & Plan:  DMV form filled out

## 2014-03-28 ENCOUNTER — Other Ambulatory Visit: Payer: Self-pay | Admitting: Family Medicine

## 2014-04-24 ENCOUNTER — Other Ambulatory Visit: Payer: Self-pay | Admitting: Family Medicine

## 2014-05-08 ENCOUNTER — Ambulatory Visit (INDEPENDENT_AMBULATORY_CARE_PROVIDER_SITE_OTHER): Payer: BC Managed Care – PPO | Admitting: Family Medicine

## 2014-05-08 VITALS — BP 118/74 | HR 70 | Temp 98.1°F | Resp 18 | Ht 71.5 in | Wt 238.8 lb

## 2014-05-08 DIAGNOSIS — E7800 Pure hypercholesterolemia, unspecified: Secondary | ICD-10-CM

## 2014-05-08 DIAGNOSIS — M109 Gout, unspecified: Secondary | ICD-10-CM

## 2014-05-08 DIAGNOSIS — I10 Essential (primary) hypertension: Secondary | ICD-10-CM

## 2014-05-08 MED ORDER — ALLOPURINOL 100 MG PO TABS: 100.0000 mg | ORAL_TABLET | Freq: Every day | ORAL | Status: DC

## 2014-05-08 MED ORDER — PRAVASTATIN SODIUM 40 MG PO TABS: 40.0000 mg | ORAL_TABLET | Freq: Every day | ORAL | Status: DC

## 2014-05-08 MED ORDER — LISINOPRIL-HYDROCHLOROTHIAZIDE 20-12.5 MG PO TABS: 1.0000 | ORAL_TABLET | Freq: Every day | ORAL | Status: DC

## 2014-05-08 NOTE — Progress Notes (Signed)
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 He's been doing well. He has no headaches or dizziness, chest pains, shortness of breath, GI or GU complaints. He takes his medicines regularly. He has been on them a long time. He has not been having any flares of his gout.  Objective: No acute distress. Alert and oriented. No carotid bruits. Chest clear. Heart regular without murmurs. Abdomen soft without mass or tenderness. No ankle edema.  Assessment: Hypertension History of hyperlipidemia History of gout  Plan: Check his labs. Continue current meds. Recommend checking labs about every 6 months since he is on both the allopurinol and the blood pressure medications. Return sooner if problems. Thank you

## 2014-05-08 NOTE — Patient Instructions (Signed)
Return in about 6 months. Because you are on the blood pressure medicine and the allopurinol I would recommend checking your blood count chemistries test twice a year.  Continue the same medications  We will know the results of your labs  Return if further problems

## 2014-05-09 LAB — LIPID PANEL
Cholesterol: 193 mg/dL (ref 0–200)
HDL: 45 mg/dL (ref >39)
LDL CALC: 103 mg/dL — AB (ref 0–99)
Total CHOL/HDL Ratio: 4.3 Ratio
Triglycerides: 224 mg/dL — ABNORMAL HIGH (ref <150)
VLDL: 45 mg/dL — ABNORMAL HIGH (ref 0–40)

## 2014-05-09 LAB — COMPREHENSIVE METABOLIC PANEL
ALT: 40 U/L (ref 0–53)
AST: 26 U/L (ref 0–37)
Albumin: 4.6 g/dL (ref 3.5–5.2)
Alkaline Phosphatase: 77 U/L (ref 39–117)
BUN: 13 mg/dL (ref 6–23)
CO2: 27 meq/L (ref 19–32)
Calcium: 9.6 mg/dL (ref 8.4–10.5)
Chloride: 102 mEq/L (ref 96–112)
Creat: 1.16 mg/dL (ref 0.50–1.35)
Glucose, Bld: 80 mg/dL (ref 70–99)
Potassium: 4.2 mEq/L (ref 3.5–5.3)
SODIUM: 138 meq/L (ref 135–145)
TOTAL PROTEIN: 7.7 g/dL (ref 6.0–8.3)
Total Bilirubin: 0.6 mg/dL (ref 0.2–1.2)

## 2014-05-09 LAB — URIC ACID: URIC ACID, SERUM: 7.7 mg/dL (ref 4.0–7.8)

## 2014-05-10 ENCOUNTER — Encounter: Payer: Self-pay | Admitting: Family Medicine

## 2014-11-24 ENCOUNTER — Other Ambulatory Visit: Payer: Self-pay | Admitting: Family Medicine

## 2014-12-29 ENCOUNTER — Other Ambulatory Visit: Payer: Self-pay | Admitting: Physician Assistant

## 2015-01-22 ENCOUNTER — Ambulatory Visit (INDEPENDENT_AMBULATORY_CARE_PROVIDER_SITE_OTHER): Payer: Self-pay | Admitting: Physician Assistant

## 2015-01-22 VITALS — BP 136/88 | HR 60 | Temp 98.0°F | Resp 18 | Ht 71.5 in | Wt 240.2 lb

## 2015-01-22 DIAGNOSIS — Z6833 Body mass index (BMI) 33.0-33.9, adult: Secondary | ICD-10-CM | POA: Insufficient documentation

## 2015-01-22 DIAGNOSIS — Z021 Encounter for pre-employment examination: Secondary | ICD-10-CM

## 2015-01-22 DIAGNOSIS — Z024 Encounter for examination for driving license: Secondary | ICD-10-CM

## 2015-01-22 NOTE — Patient Instructions (Signed)
At your next blood draw, ask to be screened for HIV.

## 2015-01-22 NOTE — Progress Notes (Signed)
Patient ID: Brandon Sandoval, male   DOB: 12/15/1955, 59 y.o.   MRN: [id number]  This patient presents for DOT examination for fitness for duty.  Last DOT certification was for 1 year, expiration date 02/09/2015.  Medical History: no  Any illness or injury in the last 5 years? no  Head/Brain Injuries, disorders or illnesses no  Seizures, epilepsy no  Eye disorders or impaired vision (except corrective lenses) no  Ear disorders, loss of hearing or balance no  Heart disease or heart attack; other cardiovascular condition no  Heart surgery (valve replacement/bypass, angioplasty, pacemaker) yes  High blood pressure no  Muscular disease no  Shortness of breath no  Lung disease, emphysema, asthma, chronic bronchitis no  Kidney disease, dialysis no  Liver disease no  Digestive problems no  Diabetes or elevated blood sugar no  Nervious or psychiatric disorders, e.g., severe depression no  Loss of, or altered consciousness no  Fainting, dizziness no  Sleep disorders, pauses in breathing while asleep, daytime sleepiness, loud snoring no  Stroke or paralysis no  Missing or impaired hand, arm, foot, leg, finger, toe no  Spinal injury or disease no  Chronic low back pain no  Regular, frequent alcohol use no  Narcotic or habit forming drug use  Current Medications: Prior to Admission medications   Medication Sig Start Date End Date Taking? Authorizing Provider  allopurinol (ZYLOPRIM) 100 MG tablet Take 1 tablet (100 mg total) by mouth daily. PATIENT NEEDS OFFICE VISIT FOR ADDITIONAL REFILLS] 05/08/14  Yes Peyton Najjaravid H Hopper, MD  aspirin 81 MG tablet Take 81 mg by mouth daily.   Yes Historical Provider, MD  lisinopril-hydrochlorothiazide (PRINZIDE,ZESTORETIC) 20-12.5 MG per tablet Take 1 tablet by mouth daily. PATIENT NEEDS OFFICE VISIT FOR ADDITIONAL REFILLS 05/08/14  Yes Peyton Najjaravid H Hopper, MD  pravastatin (PRAVACHOL) 40 MG tablet Take 1 tablet (40 mg total) by mouth daily. PATIENT NEEDS OFFICE VISIT FOR  ADDITIONAL REFILLS 12/30/14  Yes Morrell RiddleSarah L Weber, PA-C    Primary Care Provider: Default, Provider, MD, has been coming here Specialists: n/a  Medical Examiner's Comments on Health History:  HTN is well controlled on current regimen which poses no risk to driving safety.  TESTING:   Visual Acuity Screening   Right eye Left eye Both eyes  Without correction: 20/15-2 20/15-2 20/15-1  With correction:     Comments: Peripheral Vision: Right eye 85 degrees. Left eye 85 degrees.  The patient can distinguish the colors red, amber and green.  Hearing Screening Comments: The patient was able to hear a forced whisper from 10 feet.  Monocular Vision: No.  Hearing Aid used for test: No. Hearing Aid required to to meet standard: No. Distance from individual at which forced whispered voice can first be heard:   RIGHT ear 10  feet; LEFT ear 10 feet If audiometer used, record hearing loss in decibels:  RIGHT ear average N/A dB  LEFT ear average N/A dB  BP 136/88 mmHg  Pulse 60  Temp(Src) 98 F (36.7 C) (Oral)  Resp 18  Ht 5' 11.5" (1.816 m)  Wt 240 lb 3.2 oz (108.954 kg)  BMI 33.04 kg/m2  SpO2 97% Pulse rate is regular  Urine Specimen: Specific Gravity 1.010, Protein NEG, Blood NEG, Sugar NEG  Other Testing: none indicated  PHYSICAL EXAMINATION:  1. No. General Appearance: Marked overweight, tremor, signs of alcoholism, problem drinking or drug abuse. 2. No. Eyes: pupillary equality, reaction to light, accommodation, ocular motility, ocular muscle imbalance, extra ocular movement, nystagmus, exopthalmos. Ask  about retinopathy, cataracts, aphakia, glaucoma, macular degeneration and refer to a specialist if appropriate.  3. No. Ears: Scarring of tympanic membrane, occlusion of external canal, perforated eardrums.     4. No. Mouth and Throat: Irremedial deformities likely to interfere with breathing or swallowing.    5. No. Heart: Murmurs, extra sounds, enlarged heart, pacemaker,  implantable defibrillator.     6. No. Lungs and Chest, not including breast examination: Abnormal Chest wall expansion, abnormal respiratory rate, abnormal breath sounds including wheezes or alveolar rates, impaired respiratory function, cyanosis. Abnormal findings on physical exam may require further testing such as pulmonary tests and/or x ray of chest.  7. No. Abdomen and Viscera: Enlarged liver, enlarged spleen, masses, bruits, hernia, significant abdominal wall muscle weakness.  8. No. Vascular System: Abnormal pulse and amplitude, carotid or arterial bruits, varicose veins.    9. No. Genitourinary System: Hernia  10. No. Extremities-Limb impaired: Loss or impairment of leg, foot, toe, arm, hand, finger. Perceptible limp, deformities, atrophy, weakness, paralysis, clubbing, edema, hypotonia. Insufficient grasp and prehension to maintain steering wheel grip. Insufficient mobility and strength in lower limb to operate pedals properly. 11. No. Spine, other musculoskeletal: Previous surgery, deformities, limitation of motion, tenderness.  12. No. Neurological: Impaired equilibrium, coordination or speech pattern; paresthesia, asymmetric deep tendon reflexes, sensory or positional abnormalities, abnormal patellar and Babinski's reflexes, ataxia.     Comments: Normal male exam.  Certification Status: Meets standards, but periodic monitoring required due to: HTN  Driver qualified only for:  1 year  Wearing corrective lenses: NO Wearing hearing aid: no Accompanied by a N/A waiver/exemption Skill performance Evaluation (SPE) Certificate: no Driving within an exempt intracity zone: no Qualified by operation of 49 CFR 161.09: N/A  Certification expires 01/23/2016

## 2015-01-22 NOTE — Progress Notes (Signed)
  Medical screening examination/treatment/procedure(s) were performed by non-physician practitioner and as supervising physician I was immediately available for consultation/collaboration.     

## 2015-02-01 ENCOUNTER — Other Ambulatory Visit: Payer: Self-pay | Admitting: Physician Assistant

## 2015-03-07 ENCOUNTER — Other Ambulatory Visit: Payer: Self-pay | Admitting: Physician Assistant

## 2015-03-26 ENCOUNTER — Ambulatory Visit (INDEPENDENT_AMBULATORY_CARE_PROVIDER_SITE_OTHER): Payer: BLUE CROSS/BLUE SHIELD | Admitting: Urgent Care

## 2015-03-26 ENCOUNTER — Other Ambulatory Visit: Payer: Self-pay | Admitting: Urgent Care

## 2015-03-26 VITALS — BP 118/78 | HR 80 | Temp 98.5°F | Resp 18 | Ht 71.5 in | Wt 241.0 lb

## 2015-03-26 DIAGNOSIS — E669 Obesity, unspecified: Secondary | ICD-10-CM

## 2015-03-26 DIAGNOSIS — E782 Mixed hyperlipidemia: Secondary | ICD-10-CM | POA: Diagnosis not present

## 2015-03-26 DIAGNOSIS — M109 Gout, unspecified: Secondary | ICD-10-CM | POA: Diagnosis not present

## 2015-03-26 DIAGNOSIS — Z0000 Encounter for general adult medical examination without abnormal findings: Secondary | ICD-10-CM | POA: Diagnosis not present

## 2015-03-26 DIAGNOSIS — I10 Essential (primary) hypertension: Secondary | ICD-10-CM

## 2015-03-26 LAB — COMPREHENSIVE METABOLIC PANEL
ALK PHOS: 69 U/L (ref 39–117)
ALT: 40 U/L (ref 0–53)
AST: 26 U/L (ref 0–37)
Albumin: 4.5 g/dL (ref 3.5–5.2)
BILIRUBIN TOTAL: 0.7 mg/dL (ref 0.2–1.2)
BUN: 14 mg/dL (ref 6–23)
CO2: 24 mEq/L (ref 19–32)
Calcium: 9.3 mg/dL (ref 8.4–10.5)
Chloride: 104 mEq/L (ref 96–112)
Creat: 1.02 mg/dL (ref 0.50–1.35)
GLUCOSE: 92 mg/dL (ref 70–99)
Potassium: 4 mEq/L (ref 3.5–5.3)
Sodium: 139 mEq/L (ref 135–145)
Total Protein: 7.5 g/dL (ref 6.0–8.3)

## 2015-03-26 LAB — LIPID PANEL
Cholesterol: 176 mg/dL (ref 0–200)
HDL: 43 mg/dL (ref >=40)
LDL CALC: 107 mg/dL — AB (ref 0–99)
Total CHOL/HDL Ratio: 4.1 Ratio
Triglycerides: 128 mg/dL (ref <150)
VLDL: 26 mg/dL (ref 0–40)

## 2015-03-26 LAB — CBC
HCT: 40.4 % (ref 39.0–52.0)
Hemoglobin: 14.3 g/dL (ref 13.0–17.0)
MCH: 31.6 pg (ref 26.0–34.0)
MCHC: 35.4 g/dL (ref 30.0–36.0)
MCV: 89.4 fL (ref 78.0–100.0)
MPV: 10.6 fL (ref 8.6–12.4)
PLATELETS: 277 10*3/uL (ref 150–400)
RBC: 4.52 MIL/uL (ref 4.22–5.81)
RDW: 12.7 % (ref 11.5–15.5)
WBC: 6.4 10*3/uL (ref 4.0–10.5)

## 2015-03-26 LAB — URIC ACID: Uric Acid, Serum: 6.8 mg/dL (ref 4.0–7.8)

## 2015-03-26 LAB — TSH: TSH: 0.342 u[IU]/mL — ABNORMAL LOW (ref 0.350–4.500)

## 2015-03-26 NOTE — Patient Instructions (Signed)

## 2015-03-26 NOTE — Progress Notes (Signed)
MRN: [id number]  Subjective:   Mr. Brandon Sandoval is a 59 y.o. male presenting for annual physical exam.  Medical care team includes: PCP: Default, Provider, MD Vision: Does not have any visual complaints, no regular eye care. Dental: Does not get dental care. Specialists:   GI: colonoscopy performed 2010, follow up in 10 years (2020)   Brandon Sandoval has HTN (hypertension); Hypercholesteremia; Gout; and BMI 33.0-33.9,adult on his problem list.  Patient is married, lives at home with his wife and adult daughter. Things are well at home. Daughter 56(35 y/o) is also temporarily staying with them. Diet is non-compliant, does not exercise. Works as a Hospital doctordriver.  HTN - managed with lis-HCT. ROS below. Avoids salt in his diet.  HL - managed with pravastatin. Denies mental fog, myalgia.  Obesity - diet and exercise as above. Patient is fasting today.  Gout - managed with allopurinol, denies flares. Usually occurs in his right great toe.   Brandon Sandoval has a current medication list which includes the following prescription(s): allopurinol, aspirin, lisinopril-hydrochlorothiazide, and pravastatin. He has No Known Allergies.  Brandon Sandoval  has a past medical history of Hyperlipidemia; Hypertension; Gout; and Burn. Also  has no past surgical history on file.  family history includes Diabetes in his mother; Heart disease in his brother, father, and mother.  Immunizations: Does not get flu shots, last TDAP was 2013.  Review of Systems  Constitutional: Negative for fever, chills, weight loss, malaise/fatigue and diaphoresis.  HENT: Negative for congestion, ear discharge, ear pain and sore throat.   Eyes: Negative for double vision, pain and redness.  Respiratory: Negative for cough, shortness of breath and wheezing.   Cardiovascular: Negative for chest pain, palpitations, claudication and leg swelling.  Gastrointestinal: Negative for heartburn, nausea, vomiting, abdominal pain, diarrhea, constipation and blood in  stool.  Genitourinary: Negative for dysuria, urgency, frequency and hematuria.  Musculoskeletal: Negative for myalgias, back pain, joint pain and neck pain.  Skin: Negative for itching and rash.  Neurological: Negative for dizziness, tingling, tremors, seizures, weakness and headaches.  Endo/Heme/Allergies: Negative for polydipsia.  Psychiatric/Behavioral: Negative for depression, memory loss and substance abuse. The patient is not nervous/anxious and does not have insomnia.     Objective:   Vitals: BP 118/78 mmHg  Pulse 80  Temp(Src) 98.5 F (36.9 C) (Oral)  Resp 18  Ht 5' 11.5" (1.816 m)  Wt 241 lb (109.317 kg)  BMI 33.15 kg/m2  SpO2 97%  Physical Exam  Constitutional: He is oriented to person, place, and time and well-developed, well-nourished, and in no distress.  HENT:  TM's intact bilaterally, no effusions or erythema. Nares patent, nasal turbinates pink and moist. No sinus tenderness. Oropharynx clear, mucous membranes moist, dentition in good repair.  Eyes: Conjunctivae and EOM are normal. Pupils are equal, round, and reactive to light. Right eye exhibits no discharge. Left eye exhibits no discharge. No scleral icterus.  Neck: Normal range of motion. Neck supple. No JVD present. No thyromegaly present.  Cardiovascular: Normal rate, regular rhythm and intact distal pulses.  Exam reveals no gallop and no friction rub.   No murmur heard. Pulmonary/Chest: No stridor. No respiratory distress. He has no wheezes. He has no rales. He exhibits no tenderness.  Abdominal: Soft. Bowel sounds are normal. He exhibits no distension and no mass. There is no tenderness.  Genitourinary:  Patient declined.  Musculoskeletal: Normal range of motion. He exhibits no edema or tenderness.  Lymphadenopathy:    He has no cervical adenopathy.  Neurological: He is alert  and oriented to person, place, and time. He has normal reflexes.  Skin: Skin is warm and dry. No rash noted. No erythema. No  pallor.  Psychiatric: Mood and affect normal.   Assessment and Plan :   1. Annual physical exam - Discussed healthy lifestyle, diet, exercise, preventative care, vaccinations, and addressed patient's concerns.  - Labs pending  2. Essential hypertension 3. Mixed hyperlipidemia 4. 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

## 2015-03-27 LAB — T4, FREE: Free T4: 1.2 ng/dL (ref 0.80–1.80)

## 2015-03-27 LAB — T3, FREE: T3, Free: 3.1 pg/mL (ref 2.3–4.2)

## 2015-03-31 ENCOUNTER — Telehealth: Payer: Self-pay | Admitting: Urgent Care

## 2015-03-31 NOTE — Telephone Encounter (Signed)
Please call patient and let him know that his cholesterol still needs work. That together with his age, gender, weight and high blood pressure increases his risk compared to those without his diagnoses by ~22% of having a major cardiovascular event including heart attack and/or stroke in the next 10 years. I would like for the patient to make dietary modifications, eat 3 healthy balanced meals including vegetables, fruits, lean meats. Please stay away from red meat and fried foods. Starting an Omega-3 fatty acid supplement may also help patient with his cholesterol. I would like for him to return to follow up in 6 months. Thank you!

## 2015-03-31 NOTE — Telephone Encounter (Signed)
Requested patient call back and leave a reliable time for me to reach him regarding his results and plan for follow up.

## 2015-04-01 NOTE — Telephone Encounter (Signed)
lmom to cb. 

## 2015-04-04 NOTE — Telephone Encounter (Signed)
LM with a male for pt to CB 

## 2015-04-06 NOTE — Telephone Encounter (Signed)
Patient contacted multiple times and returning voicemail. Will close this encounter.

## 2015-04-11 ENCOUNTER — Other Ambulatory Visit: Payer: Self-pay | Admitting: Physician Assistant

## 2015-04-20 NOTE — Addendum Note (Signed)
Addended by: Johnnette LitterARDWELL, Jahlia Omura M on: 04/20/2015 09:05 AM   Modules accepted: Kipp BroodSmartSet

## 2015-04-20 NOTE — Addendum Note (Signed)
Addended by: Johnnette LitterARDWELL, Adorian Gwynne M on: 04/20/2015 09:06 AM   Modules accepted: Kipp BroodSmartSet

## 2015-05-26 ENCOUNTER — Other Ambulatory Visit: Payer: Self-pay | Admitting: Family Medicine

## 2015-06-12 ENCOUNTER — Other Ambulatory Visit: Payer: Self-pay | Admitting: Family Medicine

## 2015-06-24 ENCOUNTER — Telehealth: Payer: Self-pay

## 2015-06-24 ENCOUNTER — Other Ambulatory Visit: Payer: Self-pay | Admitting: Physician Assistant

## 2015-06-24 NOTE — Telephone Encounter (Signed)
Pt states he went to get his LISINOPRIL refilled and was told by the pharmacy to contact us, we gave him his other medicines and he doesn't understand why he have to come back for just this one. He understood [place] to Say he didn't have to come back for 1 year. Please call [phone]    [facility] IN [place]

## 2015-06-25 NOTE — Telephone Encounter (Signed)
This was refilled on 7/27. Called pt to let him know. Left message.

## 2015-08-23 ENCOUNTER — Other Ambulatory Visit: Payer: Self-pay | Admitting: Urgent Care

## 2015-09-15 ENCOUNTER — Other Ambulatory Visit: Payer: Self-pay | Admitting: Urgent Care

## 2015-10-23 ENCOUNTER — Other Ambulatory Visit: Payer: Self-pay | Admitting: Physician Assistant

## 2015-11-05 ENCOUNTER — Ambulatory Visit (INDEPENDENT_AMBULATORY_CARE_PROVIDER_SITE_OTHER): Payer: BLUE CROSS/BLUE SHIELD | Admitting: Emergency Medicine

## 2015-11-05 VITALS — BP 126/76 | HR 78 | Temp 98.6°F | Resp 16 | Ht 72.0 in | Wt 246.0 lb

## 2015-11-05 DIAGNOSIS — E782 Mixed hyperlipidemia: Secondary | ICD-10-CM | POA: Diagnosis not present

## 2015-11-05 DIAGNOSIS — M109 Gout, unspecified: Secondary | ICD-10-CM

## 2015-11-05 DIAGNOSIS — I10 Essential (primary) hypertension: Secondary | ICD-10-CM | POA: Diagnosis not present

## 2015-11-05 LAB — COMPREHENSIVE METABOLIC PANEL WITH GFR
ALT: 41 U/L (ref 9–46)
AST: 26 U/L (ref 10–35)
Albumin: 4.5 g/dL (ref 3.6–5.1)
Alkaline Phosphatase: 79 U/L (ref 40–115)
BUN: 13 mg/dL (ref 7–25)
CO2: 24 mmol/L (ref 20–31)
Calcium: 9.5 mg/dL (ref 8.6–10.3)
Chloride: 103 mmol/L (ref 98–110)
Creat: 1.07 mg/dL (ref 0.70–1.33)
Glucose, Bld: 88 mg/dL (ref 65–99)
Potassium: 4.1 mmol/L (ref 3.5–5.3)
Sodium: 138 mmol/L (ref 135–146)
Total Bilirubin: 0.6 mg/dL (ref 0.2–1.2)
Total Protein: 7.5 g/dL (ref 6.1–8.1)

## 2015-11-05 LAB — LIPID PANEL
CHOL/HDL RATIO: 4.1 ratio (ref <=5.0)
Cholesterol: 181 mg/dL (ref 125–200)
HDL: 44 mg/dL (ref >=40)
LDL Cholesterol: 110 mg/dL (ref <130)
Triglycerides: 137 mg/dL (ref <150)
VLDL: 27 mg/dL (ref <30)

## 2015-11-05 LAB — CBC
HCT: 40.1 % (ref 39.0–52.0)
Hemoglobin: 14.1 g/dL (ref 13.0–17.0)
MCH: 31.7 pg (ref 26.0–34.0)
MCHC: 35.2 g/dL (ref 30.0–36.0)
MCV: 90.1 fL (ref 78.0–100.0)
MPV: 10.3 fL (ref 8.6–12.4)
Platelets: 287 10*3/uL (ref 150–400)
RBC: 4.45 MIL/uL (ref 4.22–5.81)
RDW: 12.7 % (ref 11.5–15.5)
WBC: 7 10*3/uL (ref 4.0–10.5)

## 2015-11-05 LAB — TSH: TSH: 0.418 u[IU]/mL (ref 0.350–4.500)

## 2015-11-05 MED ORDER — PRAVASTATIN SODIUM 40 MG PO TABS: 40.0000 mg | ORAL_TABLET | Freq: Every day | ORAL | Status: DC

## 2015-11-05 MED ORDER — ALLOPURINOL 100 MG PO TABS
ORAL_TABLET | ORAL | Status: DC
Start: 2015-11-05 — End: 2016-03-23

## 2015-11-05 MED ORDER — LISINOPRIL-HYDROCHLOROTHIAZIDE 20-12.5 MG PO TABS: ORAL_TABLET | ORAL | Status: DC

## 2015-11-05 NOTE — Progress Notes (Signed)
Subjective:  Patient ID: Brandon Sandoval, male    DOB: 05/19/1956  Age: 59 y.o. MRN: [id number]  CC: Medication Refill   HPI Brandon Sandoval presents  with a need for refills of his medication. He has under treatment for hypercholesterolemia gout and hypertension. He hasn't had lab work done for nearly a year. He has no acute symptoms or complaints. He is tolerating his medication well he. He has no evidence of end organ injury.  History Brandon Sandoval has a past medical history of Hyperlipidemia; Hypertension; Gout; and Burn.   He has no past surgical history on file.   His  family history includes Diabetes in his mother; Heart disease in his brother, father, and mother.  He   reports that he has never smoked. He has never used smokeless tobacco. He reports that he does not drink alcohol or use illicit drugs.  Outpatient Prescriptions Prior to Visit  Medication Sig Dispense Refill  . aspirin 81 MG tablet Take 81 mg by mouth daily.    Marland Kitchen. allopurinol (ZYLOPRIM) 100 MG tablet TAKE 1 TABLET ONCE DAILY.  "OFFICE VISIT NEEDED FOR REFILLS" 90 tablet 0  . lisinopril-hydrochlorothiazide (PRINZIDE,ZESTORETIC) 20-12.5 MG tablet TAKE (1) TABLET DAILY AS NEEDED  "NO MORE REFILLS WITHOUT OFFICE VISIT" 30 tablet 0  . pravastatin (PRAVACHOL) 40 MG tablet TAKE 1 TABLET ONCE DAILY. 30 tablet 6   No facility-administered medications prior to visit.    Social History   Social History  . Marital Status: Married    Spouse Name: [place] S. Sandoval  . Number of Children: 1  . 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 Smoking status: Never Smoker   . Smokeless tobacco: Never Used  . Alcohol Use: No  . Drug Use: No  . Sexual Activity: Not Asked   Other Topics Concern  . None   Social History Narrative   Associate's Degree.   Lives with his wife and daughter.     Review of Systems  Constitutional: Negative for fever, chills and appetite change.    HENT: Negative for congestion, ear pain, postnasal drip, sinus pressure and sore throat.   Eyes: Negative for pain and redness.  Respiratory: Negative for cough, shortness of breath and wheezing.   Cardiovascular: Negative for leg swelling.  Gastrointestinal: Negative for nausea, vomiting, abdominal pain, diarrhea, constipation and blood in stool.  Endocrine: Negative for polyuria.  Genitourinary: Negative for dysuria, urgency, frequency and flank pain.  Musculoskeletal: Negative for gait problem.  Skin: Negative for rash.  Neurological: Negative for weakness and headaches.  Psychiatric/Behavioral: Negative for confusion and decreased concentration. The patient is not nervous/anxious.     Objective:  BP 126/76 mmHg  Pulse 78  Temp(Src) 98.6 F (37 C) (Oral)  Resp 16  Ht 6' (1.829 m)  Wt 246 lb (111.585 kg)  BMI 33.36 kg/m2  SpO2 97%  Physical Exam  Constitutional: He is oriented to person, place, and time. He appears well-developed and well-nourished. No distress.  HENT:  Head: Normocephalic and atraumatic.  Right Ear: External ear normal.  Left Ear: External ear normal.  Nose: Nose normal.  Eyes: Conjunctivae and EOM are normal. Pupils are equal, round, and reactive to light. No scleral icterus.  Neck: Normal range of motion. Neck supple. No tracheal deviation present.  Cardiovascular: Normal rate, regular rhythm and normal heart sounds.   Pulmonary/Chest: Effort normal. No respiratory distress. He has no wheezes. He has no  rales.  Abdominal: He exhibits no mass. There is no tenderness. There is no rebound and no guarding.  Musculoskeletal: He exhibits no edema.  Lymphadenopathy:    He has no cervical adenopathy.  Neurological: He is alert and oriented to person, place, and time. Coordination normal.  Skin: Skin is warm and dry. No rash noted.  Psychiatric: He has a normal mood and affect. His behavior is normal.      Assessment & Plan:   Moustafa was seen today for  medication refill.  Diagnoses and all orders for this visit:  Essential hypertension -     CBC -     Comprehensive metabolic panel -     Lipid panel -     TSH  Mixed hyperlipidemia -     CBC -     Comprehensive metabolic panel -     Lipid panel -     TSH  Gout without tophus, unspecified cause, unspecified chronicity, unspecified site -     CBC -     Comprehensive metabolic panel -     Lipid panel -     TSH  Other orders -     lisinopril-hydrochlorothiazide (PRINZIDE,ZESTORETIC) 20-12.5 MG tablet; TAKE (1) TABLET DAILY AS NEEDED -     allopurinol (ZYLOPRIM) 100 MG tablet; TAKE 1 TABLET ONCE DAILY. -     pravastatin (PRAVACHOL) 40 MG tablet; Take 1 tablet (40 mg total) by mouth daily.  I have changed Mr. Sokolov lisinopril-hydrochlorothiazide, allopurinol, and pravastatin. I am also having him maintain his aspirin.  Meds ordered this encounter  Medications  . lisinopril-hydrochlorothiazide (PRINZIDE,ZESTORETIC) 20-12.5 MG tablet    Sig: TAKE (1) TABLET DAILY AS NEEDED    Dispense:  90 tablet    Refill:  3  . allopurinol (ZYLOPRIM) 100 MG tablet    Sig: TAKE 1 TABLET ONCE DAILY.    Dispense:  90 tablet    Refill:  3  . pravastatin (PRAVACHOL) 40 MG tablet    Sig: Take 1 tablet (40 mg total) by mouth daily.    Dispense:  90 tablet    Refill:  1    Appropriate red flag conditions were discussed with the patient as well as actions that should be taken.  Patient expressed his understanding.  Follow-up: Return if symptoms worsen or fail to improve.  Carmelina Dane, MD

## 2015-11-05 NOTE — Patient Instructions (Signed)
Hypertension Hypertension, commonly called high blood pressure, is when the force of blood pumping through your arteries is too strong. Your arteries are the blood vessels that carry blood from your heart throughout your body. A blood pressure reading consists of a higher number over a lower number, such as 110/72. The higher number (systolic) is the pressure inside your arteries when your heart pumps. The lower number (diastolic) is the pressure inside your arteries when your heart relaxes. Ideally you want your blood pressure below 120/80. Hypertension forces your heart to work harder to pump blood. Your arteries may become narrow or stiff. Having untreated or uncontrolled hypertension can cause heart attack, stroke, kidney disease, and other problems. RISK FACTORS Some risk factors for high blood pressure are controllable. Others are not.  Risk factors you cannot control include:   Race. You may be at higher risk if you are African American.  Age. Risk increases with age.  Gender. Men are at higher risk than women before age 45 years. After age 65, women are at higher risk than men. Risk factors you can control include:  Not getting enough exercise or physical activity.  Being overweight.  Getting too much fat, sugar, calories, or salt in your diet.  Drinking too much alcohol. SIGNS AND SYMPTOMS Hypertension does not usually cause signs or symptoms. Extremely high blood pressure (hypertensive crisis) may cause headache, anxiety, shortness of breath, and nosebleed. DIAGNOSIS To check if you have hypertension, your health care provider will measure your blood pressure while you are seated, with your arm held at the level of your heart. It should be measured at least twice using the same arm. Certain conditions can cause a difference in blood pressure between your right and left arms. A blood pressure reading that is higher than normal on one occasion does not mean that you need treatment. If  it is not clear whether you have high blood pressure, you may be asked to return on a different day to have your blood pressure checked again. Or, you may be asked to monitor your blood pressure at home for 1 or more weeks. TREATMENT Treating high blood pressure includes making lifestyle changes and possibly taking medicine. Living a healthy lifestyle can help lower high blood pressure. You may need to change some of your habits. Lifestyle changes may include:  Following the DASH diet. This diet is high in fruits, vegetables, and whole grains. It is low in salt, red meat, and added sugars.  Keep your sodium intake below 2,300 mg per day.  Getting at least 30-45 minutes of aerobic exercise at least 4 times per week.  Losing weight if necessary.  Not smoking.  Limiting alcoholic beverages.  Learning ways to reduce stress. Your health care provider may prescribe medicine if lifestyle changes are not enough to get your blood pressure under control, and if one of the following is true:  You are 18-59 years of age and your systolic blood pressure is above 140.  You are 60 years of age or older, and your systolic blood pressure is above 150.  Your diastolic blood pressure is above 90.  You have diabetes, and your systolic blood pressure is over 140 or your diastolic blood pressure is over 90.  You have kidney disease and your blood pressure is above 140/90.  You have heart disease and your blood pressure is above 140/90. Your personal target blood pressure may vary depending on your medical conditions, your age, and other factors. HOME CARE INSTRUCTIONS    Have your blood pressure rechecked as directed by your health care provider.   Take medicines only as directed by your health care provider. Follow the directions carefully. Blood pressure medicines must be taken as prescribed. The medicine does not work as well when you skip doses. Skipping doses also puts you at risk for  problems.  Do not smoke.   Monitor your blood pressure at home as directed by your health care provider. SEEK MEDICAL CARE IF:   You think you are having a reaction to medicines taken.  You have recurrent headaches or feel dizzy.  You have swelling in your ankles.  You have trouble with your vision. SEEK IMMEDIATE MEDICAL CARE IF:  You develop a severe headache or confusion.  You have unusual weakness, numbness, or feel faint.  You have severe chest or abdominal pain.  You vomit repeatedly.  You have trouble breathing. MAKE SURE YOU:   Understand these instructions.  Will watch your condition.  Will get help right away if you are not doing well or get worse.   This information is not intended to replace advice given to you by your health care provider. Make sure you discuss any questions you have with your health care provider.   Document Released: 11/14/2005 Document Revised: 03/31/2015 Document Reviewed: 09/06/2013 Elsevier Interactive Patient Education 2016 Elsevier Inc.  

## 2016-01-07 ENCOUNTER — Ambulatory Visit (INDEPENDENT_AMBULATORY_CARE_PROVIDER_SITE_OTHER): Payer: Self-pay | Admitting: Physician Assistant

## 2016-01-07 VITALS — BP 130/84 | HR 75 | Temp 98.3°F | Resp 20 | Ht 72.44 in | Wt 247.0 lb

## 2016-01-07 DIAGNOSIS — Z0289 Encounter for other administrative examinations: Secondary | ICD-10-CM

## 2016-01-07 DIAGNOSIS — Z021 Encounter for pre-employment examination: Secondary | ICD-10-CM

## 2016-01-07 NOTE — Progress Notes (Signed)
Commercial Driver Medical Examination   Brandon Sandoval is a 60 y.o. male who presents today for a commercial driver fitness determination physical exam. The patient reports no problems. The following portions of the patient's history were reviewed and updated as appropriate: allergies, current medications, past family history, past medical history, past social history, past surgical history and problem list.  HTN - takes lisinopril-hctz 20-12.5 mg. Doing well on this. HLD - takes pravastatin 40 mg QD. Takes aspirin 81 mg. Tales allopurinol daily for gout prevention.  No hx DM, MI, CVA, OSA, tob use.  Review of Systems Pertinent items are noted in HPI.   Objective:    Vision/hearing:  Visual Acuity Screening   Right eye Left eye Both eyes  Without correction:     With correction:  Comments: Peripheral Vision: Right eye 85 degrees. Left eye 85 degrees. The patient can distinguish the colors red, amber and green.  Hearing Screening Comments: The patient was able to hear a forced whisper from 10 feet.  Applicant can recognize and distinguish among traffic control signals and devices showing standard red, green, and amber colors.  Corrective lenses required: No  Monocular Vision?: No  Hearing aid requirement: No  Physical Exam  Constitutional: He is oriented to person, place, and time. He appears well-developed and well-nourished.  HENT:  Head: Normocephalic and atraumatic.  Right Ear: Hearing, tympanic membrane, external ear and ear canal normal.  Left Ear: Hearing, tympanic membrane, external ear and ear canal normal.  Nose: Nose normal.  Mouth/Throat: Uvula is midline, oropharynx is clear and moist and mucous membranes are normal.  Eyes: Conjunctivae, EOM and lids are normal. Pupils are equal, round, and reactive to light. Right eye exhibits no discharge. Left eye exhibits no discharge. No scleral icterus.  Neck: Trachea normal and normal range of motion.  Neck supple. Carotid bruit is not present. No thyromegaly present.  Cardiovascular: Normal rate, regular rhythm, normal heart sounds, intact distal pulses and normal pulses.   No murmur heard. Pulmonary/Chest: Effort normal and breath sounds normal. No respiratory distress. He has no wheezes. He has no rhonchi. He has no rales.  Abdominal: Soft. Normal appearance and bowel sounds are normal. He exhibits no abdominal bruit. There is no tenderness.  Musculoskeletal: Normal range of motion. He exhibits no edema or tenderness.  Lymphadenopathy:       Head (right side): No submental, no submandibular, no tonsillar, no preauricular, no posterior auricular and no occipital adenopathy present.       Head (left side): No submental, no submandibular, no tonsillar, no preauricular, no posterior auricular and no occipital adenopathy present.    He has no cervical adenopathy.  Neurological: He is alert and oriented to person, place, and time. He has normal strength and normal reflexes. No cranial nerve deficit or sensory deficit. Coordination and gait normal.  Skin: Skin is warm, dry and intact. No lesion and no rash noted.  Psychiatric: He has a normal mood and affect. His speech is normal and behavior is normal. Judgment and thought content normal.   BP 130/84 mmHg  Pulse 75  Temp(Src) 98.3 F (36.8 C) (Oral)  Resp 20  Ht 6' 0.44" (1.84 m)  Wt 247 lb (112.038 kg)  BMI 33.09 kg/m2  SpO2 97%  Labs: Comments: Sp. Gr: 1.010 Protein: neg : Blood: trace,: Sugar: neg  Assessment:    Healthy male exam.  Meets standards, but periodic monitoring required due to HTN.  Driver qualified only for 1 year.  Plan:    Medical examiners certificate completed and printed. Return as needed.  Advised follow up for CPE. Investigate trace blood in urine at that time.

## 2016-03-23 ENCOUNTER — Ambulatory Visit (INDEPENDENT_AMBULATORY_CARE_PROVIDER_SITE_OTHER): Payer: BLUE CROSS/BLUE SHIELD | Admitting: Emergency Medicine

## 2016-03-23 VITALS — BP 128/72 | HR 81 | Temp 98.0°F | Resp 18 | Ht 73.0 in | Wt 242.4 lb

## 2016-03-23 DIAGNOSIS — Z125 Encounter for screening for malignant neoplasm of prostate: Secondary | ICD-10-CM

## 2016-03-23 DIAGNOSIS — Z1159 Encounter for screening for other viral diseases: Secondary | ICD-10-CM | POA: Diagnosis not present

## 2016-03-23 DIAGNOSIS — Z114 Encounter for screening for human immunodeficiency virus [HIV]: Secondary | ICD-10-CM | POA: Diagnosis not present

## 2016-03-23 DIAGNOSIS — Z0000 Encounter for general adult medical examination without abnormal findings: Secondary | ICD-10-CM | POA: Diagnosis not present

## 2016-03-23 DIAGNOSIS — E785 Hyperlipidemia, unspecified: Secondary | ICD-10-CM | POA: Diagnosis not present

## 2016-03-23 DIAGNOSIS — I10 Essential (primary) hypertension: Secondary | ICD-10-CM | POA: Diagnosis not present

## 2016-03-23 DIAGNOSIS — M1000 Idiopathic gout, unspecified site: Secondary | ICD-10-CM

## 2016-03-23 LAB — POCT URINALYSIS DIP (MANUAL ENTRY)
Bilirubin, UA: NEGATIVE
GLUCOSE UA: NEGATIVE
Ketones, POC UA: NEGATIVE
Leukocytes, UA: NEGATIVE
NITRITE UA: NEGATIVE
Protein Ur, POC: NEGATIVE
SPEC GRAV UA: 1.015
UROBILINOGEN UA: 1
pH, UA: 6.5

## 2016-03-23 LAB — POCT CBC
GRANULOCYTE PERCENT: 56.1 % (ref 37–80)
HCT, POC: 40.2 % — AB (ref 43.5–53.7)
HEMOGLOBIN: 14.5 g/dL (ref 14.1–18.1)
Lymph, poc: 2.8 (ref 0.6–3.4)
MCH: 32.9 pg — AB (ref 27–31.2)
MCHC: 36 g/dL — AB (ref 31.8–35.4)
MCV: 91.4 fL (ref 80–97)
MID (cbc): 0.5 (ref 0–0.9)
MPV: 7.1 fL (ref 0–99.8)
PLATELET COUNT, POC: 269 10*3/uL (ref 142–424)
POC Granulocyte: 4.3 (ref 2–6.9)
POC LYMPH PERCENT: 37.2 %L (ref 10–50)
POC MID %: 6.7 %M (ref 0–12)
RBC: 4.4 M/uL — AB (ref 4.69–6.13)
RDW, POC: 12.2 %
WBC: 7.6 10*3/uL (ref 4.6–10.2)

## 2016-03-23 LAB — COMPLETE METABOLIC PANEL WITH GFR
ALBUMIN: 4.3 g/dL (ref 3.6–5.1)
ALK PHOS: 76 U/L (ref 40–115)
ALT: 43 U/L (ref 9–46)
AST: 24 U/L (ref 10–35)
BILIRUBIN TOTAL: 0.6 mg/dL (ref 0.2–1.2)
BUN: 13 mg/dL (ref 7–25)
CALCIUM: 9.6 mg/dL (ref 8.6–10.3)
CO2: 25 mmol/L (ref 20–31)
CREATININE: 1.17 mg/dL (ref 0.70–1.33)
Chloride: 101 mmol/L (ref 98–110)
GFR, Est African American: 80 mL/min (ref >=60)
GFR, Est Non African American: 69 mL/min (ref >=60)
Glucose, Bld: 91 mg/dL (ref 65–99)
POTASSIUM: 4.5 mmol/L (ref 3.5–5.3)
Sodium: 138 mmol/L (ref 135–146)
TOTAL PROTEIN: 8 g/dL (ref 6.1–8.1)

## 2016-03-23 LAB — LIPID PANEL
CHOL/HDL RATIO: 4.4 ratio (ref <=5.0)
CHOLESTEROL: 185 mg/dL (ref 125–200)
HDL: 42 mg/dL (ref >=40)
LDL Cholesterol: 96 mg/dL (ref <130)
Triglycerides: 236 mg/dL — ABNORMAL HIGH (ref <150)
VLDL: 47 mg/dL — ABNORMAL HIGH (ref <30)

## 2016-03-23 LAB — URIC ACID: Uric Acid, Serum: 6.8 mg/dL (ref 4.0–7.8)

## 2016-03-23 MED ORDER — LISINOPRIL-HYDROCHLOROTHIAZIDE 20-12.5 MG PO TABS: ORAL_TABLET | ORAL | Status: DC

## 2016-03-23 MED ORDER — PRAVASTATIN SODIUM 40 MG PO TABS: 40.0000 mg | ORAL_TABLET | Freq: Every day | ORAL | Status: DC

## 2016-03-23 MED ORDER — ALLOPURINOL 100 MG PO TABS: ORAL_TABLET | ORAL | Status: DC

## 2016-03-23 NOTE — Patient Instructions (Addendum)
   IF you received an x-ray today, you will receive an invoice from [facility]. Please contact [facility] at [phone] with questions or concerns regarding your invoice.   IF you received labwork today, you will receive an invoice from [facility]. Please contact [facility] at [phone] with questions or concerns regarding your invoice.   Our billing staff will not be able to assist you with questions regarding bills from these companies.  You will be contacted with the lab results as soon as they are available. The fastest way to get your results is to activate your My Chart account. Instructions are located on the last page of this paperwork. If you have not heard from us regarding the results in 2 weeks, please contact this office.    Health Maintenance, Male A healthy lifestyle and preventative care can promote health and wellness.  Maintain regular health, dental, and eye exams.  Eat a healthy diet. Foods like vegetables, fruits, whole grains, low-fat dairy products, and lean protein foods contain the nutrients you need and are low in calories. Decrease your intake of foods high in solid fats, added sugars, and salt. Get information about a proper diet from your health care provider, if necessary.  Regular physical exercise is one of the most important things you can do for your health. Most adults should get at least 150 minutes of moderate-intensity exercise (any activity that increases your heart rate and causes you to sweat) each week. In addition, most adults need muscle-strengthening exercises on 2 or more days a week.   Maintain a healthy weight. The body mass index (BMI) is a screening tool to identify possible weight problems. It provides an estimate of body fat based on height and weight. Your health care provider can find your BMI and can help you achieve or maintain a healthy weight. For males 20 years and older:  A BMI  below 18.5 is considered underweight.  A BMI of 18.5 to 24.9 is normal.  A BMI of 25 to 29.9 is considered overweight.  A BMI of 30 and above is considered obese.  Maintain normal blood lipids and cholesterol by exercising and minimizing your intake of saturated fat. Eat a balanced diet with plenty of fruits and vegetables. Blood tests for lipids and cholesterol should begin at age 20 and be repeated every 5 years. If your lipid or cholesterol levels are high, you are over age 50, or you are at high risk for heart disease, you may need your cholesterol levels checked more frequently.Ongoing high lipid and cholesterol levels should be treated with medicines if diet and exercise are not working.  If you smoke, find out from your health care provider how to quit. If you do not use tobacco, do not start.  Lung cancer screening is recommended for adults aged 55-80 years who are at high risk for developing lung cancer because of a history of smoking. A yearly low-dose CT scan of the lungs is recommended for people who have at least a 30-pack-year history of smoking and are current smokers or have quit within the past 15 years. A pack year of smoking is smoking an average of 1 pack of cigarettes a day for 1 year (for example, a 30-pack-year history of smoking could mean smoking 1 pack a day for 30 years or 2 packs a day for 15 years). Yearly screening should continue until the smoker has stopped smoking for at least 15 years. Yearly screening should be stopped   for people who develop a health problem that would prevent them from having lung cancer treatment.  If you choose to drink alcohol, do not have more than 2 drinks per day. One drink is considered to be 12 oz (360 mL) of beer, 5 oz (150 mL) of wine, or 1.5 oz (45 mL) of liquor.  Avoid the use of street drugs. Do not share needles with anyone. Ask for help if you need support or instructions about stopping the use of drugs.  High blood pressure  causes heart disease and increases the risk of stroke. High blood pressure is more likely to develop in:  People who have blood pressure in the end of the normal range (100-139/85-89 mm Hg).  People who are overweight or obese.  People who are African American.  If you are 18-39 years of age, have your blood pressure checked every 3-5 years. If you are 40 years of age or older, have your blood pressure checked every year. You should have your blood pressure measured twice--once when you are at a hospital or clinic, and once when you are not at a hospital or clinic. Record the average of the two measurements. To check your blood pressure when you are not at a hospital or clinic, you can use:  An automated blood pressure machine at a pharmacy.  A home blood pressure monitor.  If you are 45-79 years old, ask your health care provider if you should take aspirin to prevent heart disease.  Diabetes screening involves taking a blood sample to check your fasting blood sugar level. This should be done once every 3 years after age 45 if you are at a normal weight and without risk factors for diabetes. Testing should be considered at a younger age or be carried out more frequently if you are overweight and have at least 1 risk factor for diabetes.  Colorectal cancer can be detected and often prevented. Most routine colorectal cancer screening begins at the age of 50 and continues through age 75. However, your health care provider may recommend screening at an earlier age if you have risk factors for colon cancer. On a yearly basis, your health care provider may provide home test kits to check for hidden blood in the stool. A small camera at the end of a tube may be used to directly examine the colon (sigmoidoscopy or colonoscopy) to detect the earliest forms of colorectal cancer. Talk to your health care provider about this at age 50 when routine screening begins. A direct exam of the colon should be repeated  every 5-10 years through age 75, unless early forms of precancerous polyps or small growths are found.  People who are at an increased risk for hepatitis B should be screened for this virus. You are considered at high risk for hepatitis B if:  You were born in a country where hepatitis B occurs often. Talk with your health care provider about which countries are considered high risk.  Your parents were born in a high-risk country and you have not received a shot to protect against hepatitis B (hepatitis B vaccine).  You have HIV or AIDS.  You use needles to inject street drugs.  You live with, or have sex with, someone who has hepatitis B.  You are a man who has sex with other men (MSM).  You get hemodialysis treatment.  You take certain medicines for conditions like cancer, organ transplantation, and autoimmune conditions.  Hepatitis C blood testing is recommended for   all people born from 1945 through 1965 and any individual with known risk factors for hepatitis C.  Healthy men should no longer receive prostate-specific antigen (PSA) blood tests as part of routine cancer screening. Talk to your health care provider about prostate cancer screening.  Testicular cancer screening is not recommended for adolescents or adult males who have no symptoms. Screening includes self-exam, a health care provider exam, and other screening tests. Consult with your health care provider about any symptoms you have or any concerns you have about testicular cancer.  Practice safe sex. Use condoms and avoid high-risk sexual practices to reduce the spread of sexually transmitted infections (STIs).  You should be screened for STIs, including gonorrhea and chlamydia if:  You are sexually active and are younger than 24 years.  You are older than 24 years, and your health care provider tells you that you are at risk for this type of infection.  Your sexual activity has changed since you were last screened,  and you are at an increased risk for chlamydia or gonorrhea. Ask your health care provider if you are at risk.  If you are at risk of being infected with HIV, it is recommended that you take a prescription medicine daily to prevent HIV infection. This is called pre-exposure prophylaxis (PrEP). You are considered at risk if:  You are a man who has sex with other men (MSM).  You are a heterosexual man who is sexually active with multiple partners.  You take drugs by injection.  You are sexually active with a partner who has HIV.  Talk with your health care provider about whether you are at high risk of being infected with HIV. If you choose to begin PrEP, you should first be tested for HIV. You should then be tested every 3 months for as long as you are taking PrEP.  Use sunscreen. Apply sunscreen liberally and repeatedly throughout the day. You should seek shade when your shadow is shorter than you. Protect yourself by wearing long sleeves, pants, a wide-brimmed hat, and sunglasses year round whenever you are outdoors.  Tell your health care provider of new moles or changes in moles, especially if there is a change in shape or color. Also, tell your health care provider if a mole is larger than the size of a pencil eraser.  A one-time screening for abdominal aortic aneurysm (AAA) and surgical repair of large AAAs by ultrasound is recommended for men aged 65-75 years who are current or former smokers.  Stay current with your vaccines (immunizations).   This information is not intended to replace advice given to you by your health care provider. Make sure you discuss any questions you have with your health care provider.   Document Released: 05/12/2008 Document Revised: 12/05/2014 Document Reviewed: 04/11/2011 Elsevier Interactive Patient Education 2016 Elsevier Inc.  

## 2016-03-23 NOTE — Progress Notes (Signed)
By signing my name below, I, Raven Small, attest that this documentation has been prepared under the direction and in the presence of Earl LitesSteve Talea Manges, MD.  Electronically Signed: Andrew Auaven Small, ED Scribe. 03/23/2016. 11:21 AM.  Chief Complaint:  Chief Complaint  Patient presents with  . Annual Exam    physical for work, has a form that needs to be signed    HPI: Brandon Sandoval is a 60 y.o. male who [facility].   He has hx of HTN, HLD, and Gout.   Last physical was 1 year ago.   Health maintance  Pt is UTD on colonoscopy. Colonoscopy 7 years ago   Immunization Immunization History  Administered Date(s) Administered  . Tdap 01/16/2012   Exercise  Pt walks at least 1 mile a week.   Family hx DM in his mother.  He denies hx of cancer. He wife has breast cancer   Social hx He denies being a smoker  Pt is a Naval architecttruck driver.    Past Medical History  Diagnosis Date  . Hyperlipidemia   . Hypertension   . Gout   . Burn     RIGHT antecubital fossa   History reviewed. No pertinent past surgical history. Social History   Social History  . Marital Status: Married    Spouse Name: [place] S. Agostino  . Number of Children: 1  . 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 Smoking status: Never Smoker   . Smokeless tobacco: Never Used  . Alcohol Use: No  . Drug Use: No  . Sexual Activity: Not Asked   Other Topics Concern  . None   Social History Narrative   Associate's Degree.   Lives with his wife and daughter.   Family History  Problem Relation Age of Onset  . Diabetes Mother   . Heart disease Mother   . Heart disease Father   . Heart disease Brother    No Known Allergies Prior to Admission medications   Medication Sig Start Date End Date Taking? Authorizing Provider  allopurinol (ZYLOPRIM) 100 MG tablet TAKE 1 TABLET ONCE DAILY. 11/05/15  Yes Carmelina DaneJeffery S Anderson, MD  aspirin 81  MG tablet Take 81 mg by mouth daily.   Yes Historical Provider, MD  lisinopril-hydrochlorothiazide (PRINZIDE,ZESTORETIC) 20-12.5 MG tablet TAKE (1) TABLET DAILY AS NEEDED 11/05/15  Yes Carmelina DaneJeffery S Anderson, MD  pravastatin (PRAVACHOL) 40 MG tablet Take 1 tablet (40 mg total) by mouth daily. 11/05/15  Yes Carmelina DaneJeffery S Anderson, MD     ROS: The patient denies fevers, chills, night sweats, unintentional weight loss, chest pain, palpitations, wheezing, dyspnea on exertion, nausea, vomiting, abdominal pain, dysuria, hematuria, melena, numbness, weakness, or tingling.   All other systems have been reviewed and were otherwise negative with the exception of those mentioned in the HPI and as above.    PHYSICAL EXAM: Filed Vitals:   03/23/16 1030  BP: 128/72  Pulse: 81  Temp: 98 F (36.7 C)  Resp: 18   Body mass index is 31.99 kg/(m^2).   General: Alert, no acute distress HEENT:  Normocephalic, atraumatic, oropharynx patent. [facility] Cardiovascular:  Regular rate and rhythm, no rubs murmurs or gallops.  No Carotid bruits, radial pulse intact. No pedal edema.  Respiratory: Clear to auscultation bilaterally.  No wheezes, rales, or rhonchi.  No cyanosis, no use of accessory musculature Abdominal: No organomegaly, abdomen is  soft and non-tender, positive bowel sounds.  No masses. Musculoskeletal: Gait intact. No edema, tenderness Skin: No rashes. Neurologic: Facial musculature symmetric. Psychiatric: Patient acts appropriately throughout our interaction. Lymphatic: No cervical or submandibular lymphadenopathy    LABS:  Results for orders placed or performed in visit on 03/23/16  POCT CBC  Result Value Ref Range   WBC 7.6 4.6 - 10.2 K/uL   Lymph, poc 2.8 0.6 - 3.4   POC LYMPH PERCENT 37.2 10 - 50 %L   MID (cbc) 0.5 0 - 0.9   POC MID % 6.7 0 - 12 %M   POC Granulocyte 4.3 2 - 6.9   Granulocyte percent 56.1 37 - 80 %G   RBC 4.40 (A) 4.69 - 6.13 M/uL   Hemoglobin 14.5 14.1 - 18.1  g/dL   HCT, POC 95.2 (A) 84.1 - 53.7 %   MCV 91.4 80 - 97 fL   MCH, POC 32.9 (A) 27 - 31.2 pg   MCHC 36.0 (A) 31.8 - 35.4 g/dL   RDW, POC 32.4 %   Platelet Count, POC 269 142 - 424 K/uL   MPV 7.1 0 - 99.8 fL  POCT urinalysis dipstick  Result Value Ref Range   Color, UA yellow yellow   Clarity, UA clear clear   Glucose, UA negative negative   Bilirubin, UA negative negative   Ketones, POC UA negative negative   Spec Grav, UA 1.015    Blood, UA trace-intact (A) negative   pH, UA 6.5    Protein Ur, POC negative negative   Urobilinogen, UA 1.0    Nitrite, UA Negative Negative   Leukocytes, UA Negative Negative    EKG/XRAY:   Primary read interpreted by Dr. Cleta Alberts at [facility] normal sinus rhythm no acute changes.   ASSESSMENT/PLAN: Patient maintains a healthy lifestyle. I refilled all of his medications. No change in medications at the present time.I personally performed the services described in this documentation, which was scribed in my presence. The recorded information has been reviewed and is accurate.   Gross sideeffects, risk and benefits, and alternatives of medications d/w patient. Patient is aware that all medications have potential sideeffects and we are unable to predict every sideeffect or drug-drug interaction that may occur.  Lesle Chris MD 03/23/2016 11:21 AM

## 2016-03-24 LAB — PSA: PSA: 0.51 ng/mL (ref <=4.00)

## 2016-03-24 LAB — HEPATITIS C ANTIBODY: HCV Ab: NEGATIVE

## 2016-03-24 LAB — HIV ANTIBODY (ROUTINE TESTING W REFLEX): HIV 1&2 Ab, 4th Generation: NONREACTIVE

## 2016-04-02 ENCOUNTER — Telehealth: Payer: Self-pay

## 2016-04-02 NOTE — Telephone Encounter (Signed)
LMOVM for pt to return call for lab results.

## 2016-04-02 NOTE — Telephone Encounter (Signed)
-----   Message from Steven A Daub, MD sent at 03/24/2016  7:36 AM EDT ----- Labs goodmild increase in triglycerides will improve with exercise and weight loss. 

## 2016-04-02 NOTE — Telephone Encounter (Signed)
-----   Message from Collene GobbleSteven A Daub, MD sent at 03/24/2016  7:36 AM EDT ----- Labs goodmild increase in triglycerides will improve with exercise and weight loss.

## 2016-04-02 NOTE — Telephone Encounter (Signed)
Pt called and results given.  Stressed exercise and wt loss.

## 2016-06-11 ENCOUNTER — Ambulatory Visit (INDEPENDENT_AMBULATORY_CARE_PROVIDER_SITE_OTHER): Payer: BLUE CROSS/BLUE SHIELD | Admitting: Emergency Medicine

## 2016-06-11 VITALS — BP 128/80 | HR 90 | Temp 98.3°F | Resp 14 | Ht 72.5 in | Wt 246.0 lb

## 2016-06-11 DIAGNOSIS — E785 Hyperlipidemia, unspecified: Secondary | ICD-10-CM

## 2016-06-11 DIAGNOSIS — I10 Essential (primary) hypertension: Secondary | ICD-10-CM

## 2016-06-11 DIAGNOSIS — Z0000 Encounter for general adult medical examination without abnormal findings: Secondary | ICD-10-CM

## 2016-06-11 DIAGNOSIS — Z0289 Encounter for other administrative examinations: Secondary | ICD-10-CM

## 2016-06-11 DIAGNOSIS — M109 Gout, unspecified: Secondary | ICD-10-CM

## 2016-06-11 NOTE — Progress Notes (Signed)
By signing my name below, I, Brandon Sandoval, attest that this documentation has been prepared under the direction and in the presence of Brandon ChrisSteven Tucker Minter, MD. Electronically Signed: Stann Oresung-Kai Sandoval, Scribe. 06/11/2016 , 9:36 AM .  Patient was seen in room 11 .  Chief Complaint:  Chief Complaint  Patient presents with  . Annual Exam    DMV     HPI: Brandon Sandoval is a 60 y.o. male who has h/o HTN [facility]. 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 He denies history of DUI's, or recent MVA's. He denies history of seizures or neurological issues.   He's had HTN for about 5 years. He's been taking his HTN medications daily.   Past Medical History  Diagnosis Date  . Hyperlipidemia   . Hypertension   . Gout   . Burn     RIGHT antecubital fossa   No past surgical history on file. Social History   Social History  . Marital Status: Married    Spouse Name: Brandon Sandoval  . Number of Children: 1  . 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 Smoking status: Never Smoker   . Smokeless tobacco: Never Used  . Alcohol Use: No  . Drug Use: No  . Sexual Activity: Not Asked   Other Topics Concern  . None   Social History Narrative   Associate's Degree.   Lives with his wife and daughter.   Family History  Problem Relation Age of Onset  . Diabetes Mother   . Heart disease Mother   . Heart disease Father   . Heart disease Brother    No Known Allergies Prior to Admission medications   Medication Sig Start Date End Date Taking? Authorizing Provider  allopurinol (ZYLOPRIM) 100 MG tablet TAKE 1 TABLET ONCE DAILY. 03/23/16  Yes Collene GobbleSteven A Sandria Mcenroe, MD  aspirin 81 MG tablet Take 81 mg by mouth daily.   Yes Historical Provider, MD  lisinopril-hydrochlorothiazide (PRINZIDE,ZESTORETIC) 20-12.5 MG tablet TAKE (1) TABLET DAILY AS NEEDED Patient taking differently: Take  1 tablet by mouth daily. TAKE (1) TABLET DAILY AS NEEDED 03/23/16  Yes Collene GobbleSteven A Sherry Blackard, MD  pravastatin (PRAVACHOL) 40 MG tablet Take 1 tablet (40 mg total) by mouth daily. 03/23/16  Yes Collene GobbleSteven A Bertice Risse, MD     ROS:  Constitutional: negative for fever, chills, night sweats, weight changes, or fatigue  HEENT: negative for vision changes, hearing loss, congestion, rhinorrhea, ST, epistaxis, or sinus pressure Cardiovascular: negative for chest pain or palpitations Respiratory: negative for hemoptysis, wheezing, shortness of breath, or cough Abdominal: negative for abdominal pain, nausea, vomiting, diarrhea, or constipation Dermatological: negative for rash Neurologic: negative for headache, dizziness, or syncope All other systems reviewed and are otherwise negative with the exception to those above and in the HPI.  PHYSICAL EXAM: Filed Vitals:   06/11/16 0924  BP: 128/80  Pulse: 90  Temp: 98.3 F (36.8 C)  Resp: 14   Body mass index is 32.89 kg/(m^2).   General: Alert, no acute distress HEENT:  Normocephalic, atraumatic, oropharynx patent. Eye: [facility]OMI[facility] Cardiovascular:  Regular rate and rhythm, no rubs murmurs or gallops.  No Carotid bruits, radial pulse intact. No pedal edema.  Respiratory: Clear to auscultation bilaterally.  No wheezes, rales, or rhonchi.  No cyanosis, no use of accessory musculature Abdominal: No organomegaly, abdomen is soft and  non-tender, positive bowel sounds. No masses. Musculoskeletal: Gait intact. No edema, tenderness Skin: No rashes. Neurologic: Facial musculature symmetric. Psychiatric: Patient acts appropriately throughout our interaction.  Lymphatic: No cervical or submandibular lymphadenopathy Genitourinary/Anorectal: No acute findings  LABS:   EKG/XRAY:     ASSESSMENT/PLAN: Patient has a normal exam. He is compliant with his medications. He's currently on medications for high blood pressure gout and high cholesterol. He had a recent complete  physical and has yearly DOT exams. I do not feel he needs to continue to have DMV exam on a regular basis. 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 The recorded information has been reviewed and is accurate.  Gross sideeffects, risk and benefits, and alternatives of medications d/w patient. Patient is aware that all medications have potential sideeffects and we are unable to predict every sideeffect or drug-drug interaction that may occur.  Brandon Chris MD 06/11/2016 9:36 AM

## 2016-06-11 NOTE — Patient Instructions (Signed)
     IF you received an x-ray today, you will receive an invoice from [facility]. Please contact [facility] at [phone] with questions or concerns regarding your invoice.   IF you received labwork today, you will receive an invoice from [facility]. Please contact [facility] at [phone] with questions or concerns regarding your invoice.   Our billing staff will not be able to assist you with questions regarding bills from these companies.  You will be contacted with the lab results as soon as they are available. The fastest way to get your results is to activate your My Chart account. Instructions are located on the last page of this paperwork. If you have not heard from us regarding the results in 2 weeks, please contact this office.      

## 2016-10-15 ENCOUNTER — Other Ambulatory Visit: Payer: Self-pay | Admitting: Emergency Medicine

## 2016-12-29 ENCOUNTER — Ambulatory Visit (INDEPENDENT_AMBULATORY_CARE_PROVIDER_SITE_OTHER): Payer: Self-pay | Admitting: Family Medicine

## 2016-12-29 VITALS — BP 118/76 | HR 70 | Temp 98.1°F | Ht 72.5 in | Wt 245.2 lb

## 2016-12-29 DIAGNOSIS — I10 Essential (primary) hypertension: Secondary | ICD-10-CM

## 2016-12-29 DIAGNOSIS — E7800 Pure hypercholesterolemia, unspecified: Secondary | ICD-10-CM

## 2016-12-29 DIAGNOSIS — M1A9XX0 Chronic gout, unspecified, without tophus (tophi): Secondary | ICD-10-CM

## 2016-12-29 DIAGNOSIS — Z6833 Body mass index (BMI) 33.0-33.9, adult: Secondary | ICD-10-CM

## 2016-12-29 DIAGNOSIS — Z024 Encounter for examination for driving license: Secondary | ICD-10-CM

## 2016-12-29 MED ORDER — LISINOPRIL-HYDROCHLOROTHIAZIDE 20-12.5 MG PO TABS: ORAL_TABLET | ORAL | 3 refills | Status: DC

## 2016-12-29 NOTE — Progress Notes (Signed)
Subjective:    Patient ID: Brandon Sandoval, male    DOB: 04/07/1956, 61 y.o.   MRN: [id number]  12/29/2016  Employment Physical (DOT)   HPI This 61 y.o. male presents for DOT physical.  BP Readings from Last 3 Encounters:  12/29/16 118/76  06/11/16 128/80  03/23/16 128/72   Wt Readings from Last 3 Encounters:  12/29/16 245 lb 3.2 oz (111.2 kg)  06/11/16 246 lb (111.6 kg)  03/23/16 242 lb 6.4 oz (110 kg)     Visual Acuity Screening   Right eye Left eye Both eyes  Without correction: 20/25 20/20 20/20   With correction:     Hearing Screening Comments: Whisper test done at 10 feet- passed    Review of Systems  Constitutional: Negative for activity change, appetite change, chills, diaphoresis, fatigue, fever and unexpected weight change.  HENT: Negative for congestion, dental problem, drooling, ear discharge, ear pain, facial swelling, hearing loss, mouth sores, nosebleeds, postnasal drip, rhinorrhea, sinus pressure, sneezing, sore throat, tinnitus, trouble swallowing and voice change.   Eyes: Negative for photophobia, pain, discharge, redness, itching and visual disturbance.  Respiratory: Negative for apnea, cough, choking, chest tightness, shortness of breath, wheezing and stridor.   Cardiovascular: Negative for chest pain, palpitations and leg swelling.  Gastrointestinal: Negative for abdominal pain, blood in stool, constipation, diarrhea, nausea and vomiting.  Endocrine: Negative for cold intolerance, heat intolerance, polydipsia, polyphagia and polyuria.  Genitourinary: Negative for decreased urine volume, difficulty urinating, discharge, dysuria, enuresis, flank pain, frequency, genital sores, hematuria, penile pain, penile swelling, scrotal swelling, testicular pain and urgency.  Musculoskeletal: Negative for arthralgias, back pain, gait problem, joint swelling, myalgias, neck pain and neck stiffness.  Skin: Negative for color change, pallor, rash and wound.    Allergic/Immunologic: Negative for environmental allergies, food allergies and immunocompromised state.  Neurological: Negative for dizziness, tremors, seizures, syncope, facial asymmetry, speech difficulty, weakness, light-headedness, numbness and headaches.  Hematological: Negative for adenopathy. Does not bruise/bleed easily.  Psychiatric/Behavioral: Negative for agitation, behavioral problems, confusion, decreased concentration, dysphoric mood, hallucinations, self-injury, sleep disturbance and suicidal ideas. The patient is not nervous/anxious and is not hyperactive.     Past Medical History:  Diagnosis Date  . Burn    RIGHT antecubital fossa  . Gout   . Hyperlipidemia   . Hypertension    History reviewed. No pertinent surgical history. No Known Allergies Current Outpatient Prescriptions  Medication Sig Dispense Refill  . allopurinol (ZYLOPRIM) 100 MG tablet TAKE 1 TABLET ONCE DAILY. 90 tablet 3  . aspirin 81 MG tablet Take 81 mg by mouth daily.    Marland Kitchen. lisinopril-hydrochlorothiazide (PRINZIDE,ZESTORETIC) 20-12.5 MG tablet TAKE (1) TABLET DAILY 90 tablet 3  . pravastatin (PRAVACHOL) 40 MG tablet Take 1 tablet (40 mg total) by mouth daily. 90 tablet 3   No current facility-administered medications for this visit.    Social History   Social History  . Marital status: Married    Spouse name: [place] S. Boothe  . Number of children: 1  . Years of education: 2914   Occupational History  . Truck Hospital doctorDriver    Social History Main Topics  . Smoking status: Never Smoker  . Smokeless tobacco: Never Used  . Alcohol use No  . Drug use: No  . Sexual activity: Not on file   Other Topics Concern  . Not on file   Social History Narrative   Employment: truck Hospital doctordriver; [place]   Associate's Degree.   Lives with his wife and daughter.  Exercise: none in 2018   Family History  Problem Relation Age of Onset  . Diabetes Mother   . Heart disease Mother   . Heart disease  Father     pacemaker  . Hypertension Father   . Kidney failure Father   . Heart disease Brother        Objective:    BP 118/76   Pulse 70   Temp 98.1 F (36.7 C) (Oral)   Ht 6' 0.5" (1.842 m)   Wt 245 lb 3.2 oz (111.2 kg)   SpO2 97%   BMI 32.80 kg/m  Physical Exam  Constitutional: He is oriented to person, place, and time. He appears well-developed and well-nourished. No distress.  HENT:  Head: Normocephalic and atraumatic.  Right Ear: External ear normal.  Left Ear: External ear normal.  Nose: Nose normal.  Mouth/Throat: Oropharynx is clear and moist.  Eyes: Conjunctivae and EOM are normal. Pupils are equal, round, and reactive to light.  Neck: Normal range of motion. Neck supple. Carotid bruit is not present. No thyromegaly present.  Cardiovascular: Normal rate, regular rhythm, normal heart sounds and intact distal pulses.  Exam reveals no gallop and no friction rub.   No murmur heard. Pulmonary/Chest: Effort normal and breath sounds normal. He has no wheezes. He has no rales.  Abdominal: Soft. Bowel sounds are normal. He exhibits no distension and no mass. There is no tenderness. There is no rebound and no guarding. Hernia confirmed negative in the right inguinal area and confirmed negative in the left inguinal area.  Genitourinary: Penis normal.  Musculoskeletal:       Right shoulder: Normal.       Left shoulder: Normal.       Right knee: Normal.       Right ankle: Normal.       Left ankle: Normal.       Cervical back: Normal.       Thoracic back: Normal.       Lumbar back: Normal. He exhibits normal range of motion.  Lymphadenopathy:    He has no cervical adenopathy.       Right: No inguinal adenopathy present.       Left: No inguinal adenopathy present.  Neurological: He is alert and oriented to person, place, and time. He has normal reflexes. No cranial nerve deficit. He exhibits normal muscle tone. Coordination normal.  Skin: Skin is warm and dry. No rash  noted. He is not diaphoretic.  Psychiatric: He has a normal mood and affect. His behavior is normal. Judgment and thought content normal.        Assessment & Plan:   1. 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 Essential hypertension   3. 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 BMI 33.0-33.9,adult    -1 year card provided due to hypertension. -recommend weight loss and exercise.  No orders of the defined types were placed in this encounter.  Meds ordered this encounter  Medications  . lisinopril-hydrochlorothiazide (PRINZIDE,ZESTORETIC) 20-12.5 MG tablet    Sig: TAKE (1) TABLET DAILY    Dispense:  90 tablet    Refill:  3    No Follow-up on file.   Nautika Cressey Paulita Fujita, M.D. Primary Care at [facility] [address] [place], [place]  [address] [phone] phone [phone] fax

## 2016-12-29 NOTE — Patient Instructions (Signed)
     IF you received an x-ray today, you will receive an invoice from [facility]. Please contact [facility] at [phone] with questions or concerns regarding your invoice.   IF you received labwork today, you will receive an invoice from [facility]. Please contact [facility] at [phone] with questions or concerns regarding your invoice.   Our billing staff will not be able to assist you with questions regarding bills from these companies.  You will be contacted with the lab results as soon as they are available. The fastest way to get your results is to activate your My Chart account. Instructions are located on the last page of this paperwork. If you have not heard from us regarding the results in 2 weeks, please contact this office.     

## 2017-02-10 ENCOUNTER — Telehealth: Payer: Self-pay | Admitting: Family Medicine

## 2017-02-10 NOTE — Telephone Encounter (Signed)
Lisinopril; pravastatin, allopurinol refills needed. [phone]

## 2017-02-12 ENCOUNTER — Other Ambulatory Visit: Payer: Self-pay | Admitting: Emergency Medicine

## 2017-02-15 MED ORDER — PRAVASTATIN SODIUM 40 MG PO TABS: 40.0000 mg | ORAL_TABLET | Freq: Every day | ORAL | 0 refills | Status: DC

## 2017-02-15 MED ORDER — LISINOPRIL-HYDROCHLOROTHIAZIDE 20-12.5 MG PO TABS: ORAL_TABLET | ORAL | 0 refills | Status: DC

## 2017-03-22 ENCOUNTER — Other Ambulatory Visit: Payer: Self-pay | Admitting: Emergency Medicine

## 2017-03-27 NOTE — Telephone Encounter (Signed)
Refill on lisinopril requested on 4/25 from pharmacy. No response. Pt is currently out of this medication.

## 2017-03-27 NOTE — Telephone Encounter (Signed)
Call ---I approved a 30 day supply of Lisinopril for patient.  He needs to follow-up for blood pressure check and cholesterol check as it has been one year since his last blood work.  Please schedule office visit with me or any other NEW provider in upcoming month. He should be fasting for his visit.  He is actually due for a complete physical exam (I only performed a DOT for him in 12/2016).

## 2017-04-01 ENCOUNTER — Ambulatory Visit (INDEPENDENT_AMBULATORY_CARE_PROVIDER_SITE_OTHER): Payer: 59 | Admitting: Family Medicine

## 2017-04-01 ENCOUNTER — Encounter: Payer: Self-pay | Admitting: Family Medicine

## 2017-04-01 VITALS — BP 125/82 | HR 78 | Temp 98.6°F | Resp 16 | Ht 72.0 in | Wt 242.0 lb

## 2017-04-01 DIAGNOSIS — I10 Essential (primary) hypertension: Secondary | ICD-10-CM

## 2017-04-01 DIAGNOSIS — E7439 Other disorders of intestinal carbohydrate absorption: Secondary | ICD-10-CM | POA: Diagnosis not present

## 2017-04-01 DIAGNOSIS — E7800 Pure hypercholesterolemia, unspecified: Secondary | ICD-10-CM | POA: Diagnosis not present

## 2017-04-01 DIAGNOSIS — Z0000 Encounter for general adult medical examination without abnormal findings: Secondary | ICD-10-CM

## 2017-04-01 DIAGNOSIS — Z125 Encounter for screening for malignant neoplasm of prostate: Secondary | ICD-10-CM

## 2017-04-01 DIAGNOSIS — M1A9XX0 Chronic gout, unspecified, without tophus (tophi): Secondary | ICD-10-CM | POA: Diagnosis not present

## 2017-04-01 LAB — POCT URINALYSIS DIP (MANUAL ENTRY)
BILIRUBIN UA: NEGATIVE mg/dL
Bilirubin, UA: NEGATIVE
Glucose, UA: NEGATIVE mg/dL
Leukocytes, UA: NEGATIVE
Nitrite, UA: NEGATIVE
PROTEIN UA: NEGATIVE mg/dL
SPEC GRAV UA: 1.015 (ref 1.010–1.025)
Urobilinogen, UA: 1 E.U./dL
pH, UA: 5.5 (ref 5.0–8.0)

## 2017-04-01 MED ORDER — PRAVASTATIN SODIUM 40 MG PO TABS: 40.0000 mg | ORAL_TABLET | Freq: Every day | ORAL | 3 refills | Status: DC

## 2017-04-01 MED ORDER — LISINOPRIL-HYDROCHLOROTHIAZIDE 20-12.5 MG PO TABS: ORAL_TABLET | ORAL | 3 refills | Status: DC

## 2017-04-01 MED ORDER — ALLOPURINOL 100 MG PO TABS: 100.0000 mg | ORAL_TABLET | Freq: Every day | ORAL | 3 refills | Status: DC

## 2017-04-01 NOTE — Patient Instructions (Addendum)
IF you received an x-ray today, you will receive an invoice from [facility]. Please contact [facility] at [phone] with questions or concerns regarding your invoice.   IF you received labwork today, you will receive an invoice from [facility]. Please contact [facility] at [phone] with questions or concerns regarding your invoice.   Our billing staff will not be able to assist you with questions regarding bills from these companies.  You will be contacted with the lab results as soon as they are available. The fastest way to get your results is to activate your My Chart account. Instructions are located on the last page of this paperwork. If you have not heard from [place] regarding the results in 2 weeks, please contact this office.     PREVE Preventive Care 40-64 Years, Male Preventive care refers to lifestyle choices and visits with your health care provider that can promote health and wellness. What does preventive care include?  A yearly physical exam. This is also called an annual well check.  Dental exams once or twice a year.  Routine eye exams. Ask your health care provider how often you should have your eyes checked.  Personal lifestyle choices, including:  Daily care of your teeth and gums.  Regular physical activity.  Eating a healthy diet.  Avoiding tobacco and drug use.  Limiting alcohol use.  Practicing safe sex.  Taking low-dose aspirin every day starting at age 68. What happens during an annual well check? The services and screenings done by your health care provider during your annual well check will depend on your age, overall health, lifestyle risk factors, and family history of disease. Counseling  Your health care provider may ask you questions about your:  Alcohol use.  Tobacco use.  Drug use.  Emotional well-being.  Home and relationship well-being.  Sexual activity.  Eating habits.  Work and work  Statistician. Screening  You may have the following tests or measurements:  Height, weight, and BMI.  Blood pressure.  Lipid and cholesterol levels. These may be checked every 5 years, or more frequently if you are over 43 years old.  Skin check.  Lung cancer screening. You may have this screening every year starting at age 48 if you have a 30-pack-year history of smoking and currently smoke or have quit within the past 15 years.  Fecal occult blood test (FOBT) of the stool. You may have this test every year starting at age 4.  Flexible sigmoidoscopy or colonoscopy. You may have a sigmoidoscopy every 5 years or a colonoscopy every 10 years starting at age 52.  Prostate cancer screening. Recommendations will vary depending on your family history and other risks.  Hepatitis C blood test.  Hepatitis B blood test.  Sexually transmitted disease (STD) testing.  Diabetes screening. This is done by checking your blood sugar (glucose) after you have not eaten for a while (fasting). You may have this done every 1-3 years. Discuss your test results, treatment options, and if necessary, the need for more tests with your health care provider. Vaccines  Your health care provider may recommend certain vaccines, such as:  Influenza vaccine. This is recommended every year.  Tetanus, diphtheria, and acellular pertussis (Tdap, Td) vaccine. You may need a Td booster every 10 years.  Varicella vaccine. You may need this if you have not been vaccinated.  Zoster vaccine. You may need this after age 9.  Measles, mumps, and rubella (MMR) vaccine. You may need at least one  dose of MMR if you were born in 1957 or later. You may also need a second dose.  Pneumococcal 13-valent conjugate (PCV13) vaccine. You may need this if you have certain conditions and have not been vaccinated.  Pneumococcal polysaccharide (PPSV23) vaccine. You may need one or two doses if you smoke cigarettes or if you have  certain conditions.  Meningococcal vaccine. You may need this if you have certain conditions.  Hepatitis A vaccine. You may need this if you have certain conditions or if you travel or work in places where you may be exposed to hepatitis A.  Hepatitis B vaccine. You may need this if you have certain conditions or if you travel or work in places where you may be exposed to hepatitis B.  Haemophilus influenzae type b (Hib) vaccine. You may need this if you have certain risk factors. Talk to your health care provider about which screenings and vaccines you need and how often you need them. This information is not intended to replace advice given to you by your health care provider. Make sure you discuss any questions you have with your health care provider. Document Released: 12/11/2015 Document Revised: 08/03/2016 Document Reviewed: 09/15/2015 Elsevier Interactive Patient Education  2017 [employer].
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
## 2017-04-02 LAB — COMPREHENSIVE METABOLIC PANEL
ALBUMIN: 4.5 g/dL (ref 3.5–5.5)
ALT: 42 IU/L (ref 0–44)
AST: 28 IU/L (ref 0–40)
Albumin/Globulin Ratio: 1.4 (ref 1.2–2.2)
Alkaline Phosphatase: 79 IU/L (ref 39–117)
BILIRUBIN TOTAL: 0.5 mg/dL (ref 0.0–1.2)
BUN / CREAT RATIO: 10 (ref 9–20)
BUN: 13 mg/dL (ref 6–24)
CHLORIDE: 102 mmol/L (ref 96–106)
CO2: 24 mmol/L (ref 18–29)
Calcium: 10 mg/dL (ref 8.7–10.2)
Creatinine, Ser: 1.24 mg/dL (ref 0.76–1.27)
GFR calc non Af Amer: 64 mL/min/{1.73_m2} (ref >59)
GFR, EST AFRICAN AMERICAN: 74 mL/min/{1.73_m2} (ref >59)
GLOBULIN, TOTAL: 3.2 g/dL (ref 1.5–4.5)
GLUCOSE: 100 mg/dL — AB (ref 65–99)
Potassium: 4.2 mmol/L (ref 3.5–5.2)
SODIUM: 143 mmol/L (ref 134–144)
TOTAL PROTEIN: 7.7 g/dL (ref 6.0–8.5)

## 2017-04-02 LAB — CBC WITH DIFFERENTIAL/PLATELET
Basophils Absolute: 0 10*3/uL (ref 0.0–0.2)
Basos: 0 %
EOS (ABSOLUTE): 0.4 10*3/uL (ref 0.0–0.4)
Eos: 5 %
HEMATOCRIT: 41.4 % (ref 37.5–51.0)
HEMOGLOBIN: 14.1 g/dL (ref 13.0–17.7)
IMMATURE GRANS (ABS): 0 10*3/uL (ref 0.0–0.1)
Immature Granulocytes: 0 %
LYMPHS ABS: 3.4 10*3/uL — AB (ref 0.7–3.1)
Lymphs: 43 %
MCH: 31.9 pg (ref 26.6–33.0)
MCHC: 34.1 g/dL (ref 31.5–35.7)
MCV: 94 fL (ref 79–97)
MONOCYTES: 7 %
Monocytes Absolute: 0.5 10*3/uL (ref 0.1–0.9)
Neutrophils Absolute: 3.5 10*3/uL (ref 1.4–7.0)
Neutrophils: 45 %
Platelets: 271 10*3/uL (ref 150–379)
RBC: 4.42 x10E6/uL (ref 4.14–5.80)
RDW: 13 % (ref 12.3–15.4)
WBC: 7.8 10*3/uL (ref 3.4–10.8)

## 2017-04-02 LAB — PSA: PROSTATE SPECIFIC AG, SERUM: 0.4 ng/mL (ref 0.0–4.0)

## 2017-04-02 LAB — HEMOGLOBIN A1C
Est. average glucose Bld gHb Est-mCnc: 97 mg/dL
Hgb A1c MFr Bld: 5 % (ref 4.8–5.6)

## 2017-04-02 LAB — URIC ACID: Uric Acid: 8 mg/dL (ref 3.7–8.6)

## 2017-04-02 LAB — LIPID PANEL
CHOL/HDL RATIO: 4.3 ratio (ref 0.0–5.0)
Cholesterol, Total: 188 mg/dL (ref 100–199)
HDL: 44 mg/dL (ref >39)
LDL Calculated: 104 mg/dL — ABNORMAL HIGH (ref 0–99)
TRIGLYCERIDES: 200 mg/dL — AB (ref 0–149)
VLDL Cholesterol Cal: 40 mg/dL (ref 5–40)

## 2017-04-02 LAB — TSH: TSH: 0.653 u[IU]/mL (ref 0.450–4.500)

## 2017-12-14 ENCOUNTER — Ambulatory Visit (INDEPENDENT_AMBULATORY_CARE_PROVIDER_SITE_OTHER): Payer: Self-pay | Admitting: Urgent Care

## 2017-12-14 ENCOUNTER — Encounter: Payer: Self-pay | Admitting: Urgent Care

## 2017-12-14 VITALS — BP 139/88 | HR 83 | Temp 98.2°F | Resp 18 | Ht 72.0 in | Wt 246.0 lb

## 2017-12-14 DIAGNOSIS — Z024 Encounter for examination for driving license: Secondary | ICD-10-CM

## 2017-12-14 DIAGNOSIS — I10 Essential (primary) hypertension: Secondary | ICD-10-CM

## 2017-12-14 DIAGNOSIS — E7800 Pure hypercholesterolemia, unspecified: Secondary | ICD-10-CM

## 2017-12-14 NOTE — Patient Instructions (Signed)
Health Maintenance, Male A healthy lifestyle and preventive care is important for your health and wellness. Ask your health care provider about what schedule of regular examinations is right for you. What should I know about weight and diet? Eat a Healthy Diet  Eat plenty of vegetables, fruits, whole grains, low-fat dairy products, and lean protein.  Do not eat a lot of foods high in solid fats, added sugars, or salt.  Maintain a Healthy Weight Regular exercise can help you achieve or maintain a healthy weight. You should:  Do at least 150 minutes of exercise each week. The exercise should increase your heart rate and make you sweat (moderate-intensity exercise).  Do strength-training exercises at least twice a week.  Watch Your Levels of Cholesterol and Blood Lipids  Have your blood tested for lipids and cholesterol every 5 years starting at 60 years of age. If you are at high risk for heart disease, you should start having your blood tested when you are 60 years old. You may need to have your cholesterol levels checked more often if: ? Your lipid or cholesterol levels are high. ? You are older than 60 years of age. ? You are at high risk for heart disease.  What should I know about cancer screening? Many types of cancers can be detected early and may often be prevented. Lung Cancer  You should be screened every year for lung cancer if: ? You are a current smoker who has smoked for at least 30 years. ? You are a former smoker who has quit within the past 15 years.  Talk to your health care provider about your screening options, when you should start screening, and how often you should be screened.  Colorectal Cancer  Routine colorectal cancer screening usually begins at 60 years of age and should be repeated every 5-10 years until you are 60 years old. You may need to be screened more often if early forms of precancerous polyps or small growths are found. Your health care provider  may recommend screening at an earlier age if you have risk factors for colon cancer.  Your health care provider may recommend using home test kits to check for hidden blood in the stool.  A small camera at the end of a tube can be used to examine your colon (sigmoidoscopy or colonoscopy). This checks for the earliest forms of colorectal cancer.  Prostate and Testicular Cancer  Depending on your age and overall health, your health care provider may do certain tests to screen for prostate and testicular cancer.  Talk to your health care provider about any symptoms or concerns you have about testicular or prostate cancer.  Skin Cancer  Check your skin from head to toe regularly.  Tell your health care provider about any new moles or changes in moles, especially if: ? There is a change in a mole's size, shape, or color. ? You have a mole that is larger than a pencil eraser.  Always use sunscreen. Apply sunscreen liberally and repeat throughout the day.  Protect yourself by wearing long sleeves, pants, a wide-brimmed hat, and sunglasses when outside.  What should I know about heart disease, diabetes, and high blood pressure?  If you are 18-39 years of age, have your blood pressure checked every 3-5 years. If you are 40 years of age or older, have your blood pressure checked every year. You should have your blood pressure measured twice-once when you are at a hospital or clinic, and once when   you are not at a hospital or clinic. Record the average of the two measurements. To check your blood pressure when you are not at a hospital or clinic, you can use: ? An automated blood pressure machine at a pharmacy. ? A home blood pressure monitor.  Talk to your health care provider about your target blood pressure.  If you are between 45-79 years old, ask your health care provider if you should take aspirin to prevent heart disease.  Have regular diabetes screenings by checking your fasting blood  sugar level. ? If you are at a normal weight and have a low risk for diabetes, have this test once every three years after the age of 45. ? If you are overweight and have a high risk for diabetes, consider being tested at a younger age or more often.  A one-time screening for abdominal aortic aneurysm (AAA) by ultrasound is recommended for men aged 65-75 years who are current or former smokers. What should I know about preventing infection? Hepatitis B If you have a higher risk for hepatitis B, you should be screened for this virus. Talk with your health care provider to find out if you are at risk for hepatitis B infection. Hepatitis C Blood testing is recommended for:  Everyone born from 1945 through 1965.  Anyone with known risk factors for hepatitis C.  Sexually Transmitted Diseases (STDs)  You should be screened each year for STDs including gonorrhea and chlamydia if: ? You are sexually active and are younger than 60 years of age. ? You are older than 60 years of age and your health care provider tells you that you are at risk for this type of infection. ? Your sexual activity has changed since you were last screened and you are at an increased risk for chlamydia or gonorrhea. Ask your health care provider if you are at risk.  Talk with your health care provider about whether you are at high risk of being infected with HIV. Your health care provider may recommend a prescription medicine to help prevent HIV infection.  What else can I do?  Schedule regular health, dental, and eye exams.  Stay current with your vaccines (immunizations).  Do not use any tobacco products, such as cigarettes, chewing tobacco, and e-cigarettes. If you need help quitting, ask your health care provider.  Limit alcohol intake to no more than 2 drinks per day. One drink equals 12 ounces of beer, 5 ounces of wine, or 1 ounces of hard liquor.  Do not use street drugs.  Do not share needles.  Ask your  health care provider for help if you need support or information about quitting drugs.  Tell your health care provider if you often feel depressed.  Tell your health care provider if you have ever been abused or do not feel safe at home. This information is not intended to replace advice given to you by your health care provider. Make sure you discuss any questions you have with your health care provider. Document Released: 05/12/2008 Document Revised: 07/13/2016 Document Reviewed: 08/18/2015 Elsevier Interactive Patient Education  2018 [employer]     IF you received an x-ray today, you will receive an invoice from [facility]. Please contact [facility] at [phone] with questions or concerns regarding your invoice.   IF you received labwork today, you will receive an invoice from [facility]. Please contact [facility] at [phone] with questions or concerns regarding your invoice.   Our billing staff will not be   able to assist you with questions regarding bills from these companies.  You will be contacted with the lab results as soon as they are available. The fastest way to get your results is to activate your My Chart account. Instructions are located on the last page of this paperwork. If you have not heard from us regarding the results in 2 weeks, please contact this office.       

## 2017-12-14 NOTE — Progress Notes (Signed)
   Commercial Driver Medical Examination   Brandon Sandoval is a 62 y.o. male who presents today for a DOT physical exam. The patient reports history of HTN, HL. Denies dizziness, chronic headache, blurred vision, chest pain, shortness of breath, heart racing, palpitations, nausea, vomiting, abdominal pain, hematuria, lower leg swelling. Denies smoking cigarettes or drinking alcohol.   The following portions of the patient's history were reviewed and updated as appropriate: allergies, current medications, past family history, past medical history, past social history and past surgical history.  Objective:   BP 139/88   Pulse 83   Temp 98.2 F (36.8 C) (Oral)   Resp 18   Ht 6' (1.829 m)   Wt 246 lb (111.6 kg)   SpO2 94%   BMI 33.36 kg/m   Vision/hearing: Hearing Screening Comments: The patient was able to hear a forced whisper from 10 feet. Vision Screening Comments: Peripheral Vision: Right eye 85 degrees. Left eye 85 degrees.  The patient can distinguish the colors red, amber and green.  Patient can recognize and distinguish among traffic control signals and devices showing standard red, green, and amber colors.  Corrective lenses required: No  Monocular Vision?: No  Hearing aid requirement: No  Physical Exam  Constitutional: He is oriented to person, place, and time. He appears well-developed and well-nourished.  HENT:  TM's intact bilaterally, no effusions or erythema. Nasal turbinates pink and moist, nasal passages patent. No sinus tenderness. Oropharynx clear, mucous membranes moist, dentition in good repair.  Eyes: Conjunctivae and EOM are normal. Pupils are equal, round, and reactive to light. Right eye exhibits no discharge. Left eye exhibits no discharge. No scleral icterus.  Neck: Normal range of motion. Neck supple. No thyromegaly present.  Cardiovascular: Normal rate, regular rhythm and intact distal pulses. Exam reveals no gallop and no friction rub.  No murmur  heard. Pulmonary/Chest: No stridor. No respiratory distress. He has no wheezes. He has no rales.  Abdominal: Soft. Bowel sounds are normal. He exhibits no distension and no mass. There is no tenderness.  Musculoskeletal: Normal range of motion. He exhibits no edema or tenderness.  Lymphadenopathy:    He has no cervical adenopathy.  Neurological: He is alert and oriented to person, place, and time. He has normal reflexes. He displays normal reflexes. Coordination normal.  Skin: Skin is warm and dry. No rash noted. No erythema. No pallor.  Psychiatric: He has a normal mood and affect.   Labs: Comments: Urine Specimen:  SpGr:  1.010  Blood:  Trace   Protein:   Negative   Sugar:  Negative  Assessment:    Healthy male exam.  Meets standards, but periodic monitoring required due to HTN, HL.  Driver qualified only for 1 year.    Plan:   Medical examiners certificate completed and printed. Return as needed.  Wallis BambergMario Birt Reinoso, PA-C Primary Care at [facility] [phone] 12/14/2017  11:34 AM

## 2018-04-17 ENCOUNTER — Encounter: Payer: Self-pay | Admitting: Physician Assistant

## 2018-04-17 ENCOUNTER — Ambulatory Visit (INDEPENDENT_AMBULATORY_CARE_PROVIDER_SITE_OTHER): Payer: 59 | Admitting: Physician Assistant

## 2018-04-17 DIAGNOSIS — M1A0710 Idiopathic chronic gout, right ankle and foot, without tophus (tophi): Secondary | ICD-10-CM

## 2018-04-17 DIAGNOSIS — Z130 Encounter for screening for diseases of the blood and blood-forming organs and certain disorders involving the immune mechanism: Secondary | ICD-10-CM

## 2018-04-17 DIAGNOSIS — Z7689 Persons encountering health services in other specified circumstances: Secondary | ICD-10-CM

## 2018-04-17 DIAGNOSIS — E7800 Pure hypercholesterolemia, unspecified: Secondary | ICD-10-CM

## 2018-04-17 DIAGNOSIS — Z5181 Encounter for therapeutic drug level monitoring: Secondary | ICD-10-CM

## 2018-04-17 DIAGNOSIS — I10 Essential (primary) hypertension: Secondary | ICD-10-CM

## 2018-04-17 DIAGNOSIS — Z79899 Other long term (current) drug therapy: Secondary | ICD-10-CM

## 2018-04-17 DIAGNOSIS — E6609 Other obesity due to excess calories: Secondary | ICD-10-CM

## 2018-04-17 DIAGNOSIS — Z131 Encounter for screening for diabetes mellitus: Secondary | ICD-10-CM

## 2018-04-17 NOTE — Patient Instructions (Signed)
For your blood pressure: - Goal <130/80 - baby aspirin 81 mg daily to help prevent heart attack/stroke - monitor and log blood pressures at home - check around the same time each day in a relaxed setting - Limit salt to <2000 mg/day - Follow DASH eating plan - limit alcohol to 2 standard drinks per day for men and 1 per day for women - avoid tobacco products - weight loss: 7% of current body weight - follow-up every 6 months for your blood pressure    DASH Eating Plan DASH stands for "Dietary Approaches to Stop Hypertension." The DASH eating plan is a healthy eating plan that has been shown to reduce high blood pressure (hypertension). It may also reduce your risk for type 2 diabetes, heart disease, and stroke. The DASH eating plan may also help with weight loss. What are tips for following this plan? General guidelines  Avoid eating more than 2,300 mg (milligrams) of salt (sodium) a day. If you have hypertension, you may need to reduce your sodium intake to 1,500 mg a day.  Limit alcohol intake to no more than 1 drink a day for nonpregnant women and 2 drinks a day for men. One drink equals 12 oz of beer, 5 oz of wine, or 1 oz of hard liquor.  Work with your health care provider to maintain a healthy body weight or to lose weight. Ask what an ideal weight is for you.  Get at least 30 minutes of exercise that causes your heart to beat faster (aerobic exercise) most days of the week. Activities may include walking, swimming, or biking.  Work with your health care provider or diet and nutrition specialist (dietitian) to adjust your eating plan to your individual calorie needs. Reading food labels  Check food labels for the amount of sodium per serving. Choose foods with less than 5 percent of the Daily Value of sodium. Generally, foods with less than 300 mg of sodium per serving fit into this eating plan.  To find whole grains, look for the word "whole" as the first word in the  ingredient list. Shopping  Buy products labeled as "low-sodium" or "no salt added."  Buy fresh foods. Avoid canned foods and premade or frozen meals. Cooking  Avoid adding salt when cooking. Use salt-free seasonings or herbs instead of table salt or sea salt. Check with your health care provider or pharmacist before using salt substitutes.  Do not fry foods. Cook foods using healthy methods such as baking, boiling, grilling, and broiling instead.  Cook with heart-healthy oils, such as olive, canola, soybean, or sunflower oil. Meal planning   Eat a balanced diet that includes: ? 5 or more servings of fruits and vegetables each day. At each meal, try to fill half of your plate with fruits and vegetables. ? Up to 6-8 servings of whole grains each day. ? Less than 6 oz of lean meat, poultry, or fish each day. A 3-oz serving of meat is about the same size as a deck of cards. One egg equals 1 oz. ? 2 servings of low-fat dairy each day. ? A serving of nuts, seeds, or beans 5 times each week. ? Heart-healthy fats. Healthy fats called Omega-3 fatty acids are found in foods such as flaxseeds and coldwater fish, like sardines, salmon, and mackerel.  Limit how much you eat of the following: ? Canned or prepackaged foods. ? Food that is high in trans fat, such as fried foods. ? Food that is high in   saturated fat, such as fatty meat. ? Sweets, desserts, sugary drinks, and other foods with added sugar. ? Full-fat dairy products.  Do not salt foods before eating.  Try to eat at least 2 vegetarian meals each week.  Eat more home-cooked food and less restaurant, buffet, and fast food.  When eating at a restaurant, ask that your food be prepared with less salt or no salt, if possible. What foods are recommended? The items listed may not be a complete list. Talk with your dietitian about what dietary choices are best for you. Grains Whole-grain or whole-wheat bread. Whole-grain or whole-wheat  pasta. Brown rice. Oatmeal. Quinoa. Bulgur. Whole-grain and low-sodium cereals. Pita bread. Low-fat, low-sodium crackers. Whole-wheat flour tortillas. Vegetables Fresh or frozen vegetables (raw, steamed, roasted, or grilled). Low-sodium or reduced-sodium tomato and vegetable juice. Low-sodium or reduced-sodium tomato sauce and tomato paste. Low-sodium or reduced-sodium canned vegetables. Fruits All fresh, dried, or frozen fruit. Canned fruit in natural juice (without added sugar). Meat and other protein foods Skinless chicken or turkey. Ground chicken or turkey. Pork with fat trimmed off. Fish and seafood. Egg whites. Dried beans, peas, or lentils. Unsalted nuts, nut butters, and seeds. Unsalted canned beans. Lean cuts of beef with fat trimmed off. Low-sodium, lean deli meat. Dairy Low-fat (1%) or fat-free (skim) milk. Fat-free, low-fat, or reduced-fat cheeses. Nonfat, low-sodium ricotta or cottage cheese. Low-fat or nonfat yogurt. Low-fat, low-sodium cheese. Fats and oils Soft margarine without trans fats. Vegetable oil. Low-fat, reduced-fat, or light mayonnaise and salad dressings (reduced-sodium). Canola, safflower, olive, soybean, and sunflower oils. Avocado. Seasoning and other foods Herbs. Spices. Seasoning mixes without salt. Unsalted popcorn and pretzels. Fat-free sweets. What foods are not recommended? The items listed may not be a complete list. Talk with your dietitian about what dietary choices are best for you. Grains Baked goods made with fat, such as croissants, muffins, or some breads. Dry pasta or rice meal packs. Vegetables Creamed or fried vegetables. Vegetables in a cheese sauce. Regular canned vegetables (not low-sodium or reduced-sodium). Regular canned tomato sauce and paste (not low-sodium or reduced-sodium). Regular tomato and vegetable juice (not low-sodium or reduced-sodium). Pickles. Olives. Fruits Canned fruit in a light or heavy syrup. Fried fruit. Fruit in cream or  butter sauce. Meat and other protein foods Fatty cuts of meat. Ribs. Fried meat. Bacon. Sausage. Bologna and other processed lunch meats. Salami. Fatback. Hotdogs. Bratwurst. Salted nuts and seeds. Canned beans with added salt. Canned or smoked fish. Whole eggs or egg yolks. Chicken or turkey with skin. Dairy Whole or 2% milk, cream, and half-and-half. Whole or full-fat cream cheese. Whole-fat or sweetened yogurt. Full-fat cheese. Nondairy creamers. Whipped toppings. Processed cheese and cheese spreads. Fats and oils Butter. Stick margarine. Lard. Shortening. Ghee. Bacon fat. Tropical oils, such as coconut, palm kernel, or palm oil. Seasoning and other foods Salted popcorn and pretzels. Onion salt, garlic salt, seasoned salt, table salt, and sea salt. Worcestershire sauce. Tartar sauce. Barbecue sauce. Teriyaki sauce. Soy sauce, including reduced-sodium. Steak sauce. Canned and packaged gravies. Fish sauce. Oyster sauce. Cocktail sauce. Horseradish that you find on the shelf. Ketchup. Mustard. Meat flavorings and tenderizers. Bouillon cubes. Hot sauce and Tabasco sauce. Premade or packaged marinades. Premade or packaged taco seasonings. Relishes. Regular salad dressings. Where to find more information:  National Heart, Lung, and Blood Institute: www.nhlbi.nih.gov  American Heart Association: www.heart.org Summary  The DASH eating plan is a healthy eating plan that has been shown to reduce high blood pressure (hypertension). It may also   reduce your risk for type 2 diabetes, heart disease, and stroke.  With the DASH eating plan, you should limit salt (sodium) intake to 2,300 mg a day. If you have hypertension, you may need to reduce your sodium intake to 1,500 mg a day.  When on the DASH eating plan, aim to eat more fresh fruits and vegetables, whole grains, lean proteins, low-fat dairy, and heart-healthy fats.  Work with your health care provider or diet and nutrition specialist (dietitian) to  adjust your eating plan to your individual calorie needs. This information is not intended to replace advice given to you by your health care provider. Make sure you discuss any questions you have with your health care provider. Document Released: 11/03/2011 Document Revised: 11/07/2016 Document Reviewed: 11/07/2016 Elsevier Interactive Patient Education  2018 Elsevier Inc.  

## 2018-04-17 NOTE — Progress Notes (Signed)
HPI:                                                                Brandon Sandoval is a 62 y.o. male who presents to [facility]: Primary Care Sports Medicine today to establish care  Current concerns: medication refills  Hx of gout: affects right ankle and right toe. Takes Allopurinol daily for years. Compliants with medications. Last flare was more than 1 year ago  HTN: taking Lisinopril-HCTZ daily. Compliant with medications. Does not check BP's at home.  Denies vision change, headache, chest pain with exertion, orthopnea, lightheadedness, syncope and edema. Risk factors include: HLD, male sex, age>55  HLD: taking Pravastatin daily. Compliant with medications. Denies myalgias.  Depression screen [facility] 2/9 04/01/2017 12/29/2016 06/11/2016 03/23/2016 01/07/2016  Decreased Interest 0 0 0 0 0  Down, Depressed, Hopeless 0 0 0 0 0  PHQ - 2 Score 0 0 0 0 0    No flowsheet data found.    Past Medical History:  Diagnosis Date  . Burn    RIGHT antecubital fossa  . Gout   . Hyperlipidemia   . Hypertension    Past Surgical History:  Procedure Laterality Date  . NO PAST SURGERIES     Social History   Tobacco Use  . Smoking status: Never Smoker  . Smokeless tobacco: Never Used  Substance Use Topics  . Alcohol use: No    Alcohol/week: 0.0 oz   family history includes Diabetes in his mother; Heart disease in his brother, father, and mother; Hypertension in his father; Kidney failure in his father.    ROS: negative except as noted in the HPI  Medications: Current Outpatient Medications  Medication Sig Dispense Refill  . allopurinol (ZYLOPRIM) 100 MG tablet Take 1 tablet (100 mg total) by mouth daily. 90 tablet 3  . aspirin 81 MG tablet Take 81 mg by mouth daily.    Marland Kitchen lisinopril-hydrochlorothiazide (PRINZIDE,ZESTORETIC) 20-12.5 MG tablet TAKE (1) TABLET DAILY. 90 tablet 3  . pravastatin (PRAVACHOL) 40 MG tablet Take 1 tablet (40 mg total) by mouth daily. 90 tablet 3    No current facility-administered medications for this visit.    No Known Allergies     Objective:  BP 138/87   Pulse 100   Ht 6' (1.829 m)   Wt 252 lb (114.3 kg)   BMI 34.18 kg/m  Gen:  alert, not ill-appearing, no distress, appropriate for age, obese male HEENT: head normocephalic without obvious abnormality, conjunctiva and cornea clear, trachea midline, no carotid bruit Pulm: Normal work of breathing, normal phonation, clear to auscultation bilaterally, no wheezes, rales or rhonchi CV: Normal rate, regular rhythm, s1 and s2 distinct, no murmurs, clicks or rubs  Neuro: alert and oriented x 3, no tremor MSK: extremities atraumatic, normal gait and station, trace peripheral edema Skin: intact, no rashes on exposed skin, no jaundice, no cyanosis Psych: well-groomed, cooperative, good eye contact, euthymic mood, affect mood-congruent, speech is articulate, and thought processes clear and goal-directed    No results found for this or any previous visit (from the past 72 hour(s)). No results found.    Assessment and Plan: 62 y.o. male with   Encounter to establish care  Essential hypertension - Plan: COMPLETE METABOLIC PANEL WITH GFR  Hypercholesteremia - Plan: Lipid Panel w/reflex Direct LDL  Idiopathic chronic gout of right ankle without tophus - Plan: Uric acid  Class 1 obesity due to excess calories with serious comorbidity in adult, unspecified BMI - Plan: Hemoglobin A1c  Medication monitoring encounter - Plan: COMPLETE METABOLIC PANEL WITH GFR, Lipid Panel w/reflex Direct LDL, Uric acid, Hemoglobin A1c, Ambulatory referral to Gastroenterology  Encounter for monitoring statin therapy - Plan: COMPLETE METABOLIC PANEL WITH GFR, Lipid Panel w/reflex Direct LDL  Encounter for monitoring allopurinol therapy - Plan: Uric acid  Screening for diabetes mellitus - Plan: Hemoglobin A1c  Screening for blood disease - Plan: COMPLETE METABOLIC PANEL WITH GFR,  CBC  - Personally reviewed PMH, PSH, PFH, medications, allergies, HM - Age-appropriate cancer screening: colonoscopy due July 2019 per patient, referral placed - Influenza n/a - Tdap UTD - PHQ2 negative  Hypertension BP Readings from Last 3 Encounters:  04/17/18 138/87  12/14/17 139/88  04/01/17 125/82  - BP out of range today. Labs pending. Plan to increase Lisinopril-HCTZ to 20-25  - cont baby asa for primary prevention - counseled on therapeutic lifestyle changes  HLD Lab Results  Component Value Date   CHOL 188 04/01/2017   HDL 44 04/01/2017   LDLCALC 104 (H) 04/01/2017   TRIG 200 (H) 04/01/2017   CHOLHDL 4.3 04/01/2017  - LDL goal <100 - fasting lipids pending - plan to continue Pravastatin 40 mg - heart healthy diet  Class 1 Obesity  Wt Readings from Last 3 Encounters:  04/17/18 252 lb (114.3 kg)  12/14/17 246 lb (111.6 kg)  04/01/17 242 lb (109.8 kg)  - counseled on weight loss through decreased caloric intake and increased aerobic exercise    Patient education and anticipatory guidance given Patient agrees with treatment plan Follow-up in 6 months for medication management or sooner as needed if symptoms worsen or fail to improve  Levonne Hubert PA-C

## 2018-04-18 LAB — LIPID PANEL W/REFLEX DIRECT LDL
CHOL/HDL RATIO: 4.6 (calc) (ref <5.0)
CHOLESTEROL: 181 mg/dL (ref <200)
HDL: 39 mg/dL — AB (ref >40)
LDL CHOLESTEROL (CALC): 99 mg/dL
Non-HDL Cholesterol (Calc): 142 mg/dL (calc) — ABNORMAL HIGH (ref <130)
TRIGLYCERIDES: 321 mg/dL — AB (ref <150)

## 2018-04-18 LAB — COMPLETE METABOLIC PANEL WITH GFR
AG Ratio: 1.3 (calc) (ref 1.0–2.5)
ALKALINE PHOSPHATASE (APISO): 73 U/L (ref 40–115)
ALT: 35 U/L (ref 9–46)
AST: 22 U/L (ref 10–35)
Albumin: 4.4 g/dL (ref 3.6–5.1)
BILIRUBIN TOTAL: 0.6 mg/dL (ref 0.2–1.2)
BUN: 14 mg/dL (ref 7–25)
CHLORIDE: 106 mmol/L (ref 98–110)
CO2: 28 mmol/L (ref 20–32)
CREATININE: 1.22 mg/dL (ref 0.70–1.33)
Calcium: 9.5 mg/dL (ref 8.6–10.3)
GFR, Est African American: 75 mL/min/{1.73_m2} (ref >=60)
GFR, Est Non African American: 64 mL/min/{1.73_m2} (ref >=60)
GLUCOSE: 109 mg/dL — AB (ref 65–99)
Globulin: 3.4 g/dL (calc) (ref 1.9–3.7)
Potassium: 3.6 mmol/L (ref 3.5–5.3)
Sodium: 142 mmol/L (ref 135–146)
Total Protein: 7.8 g/dL (ref 6.1–8.1)

## 2018-04-18 LAB — CBC
HCT: 40.2 % (ref 38.5–50.0)
Hemoglobin: 13.9 g/dL (ref 13.2–17.1)
MCH: 31.6 pg (ref 27.0–33.0)
MCHC: 34.6 g/dL (ref 32.0–36.0)
MCV: 91.4 fL (ref 80.0–100.0)
MPV: 10.5 fL (ref 7.5–12.5)
PLATELETS: 285 10*3/uL (ref 140–400)
RBC: 4.4 10*6/uL (ref 4.20–5.80)
RDW: 11.5 % (ref 11.0–15.0)
WBC: 8 10*3/uL (ref 3.8–10.8)

## 2018-04-18 LAB — HEMOGLOBIN A1C
Hgb A1c MFr Bld: 5 % of total Hgb (ref <5.7)
Mean Plasma Glucose: 97 (calc)
eAG (mmol/L): 5.4 (calc)

## 2018-04-18 LAB — URIC ACID: Uric Acid, Serum: 8 mg/dL (ref 4.0–8.0)

## 2018-04-18 MED ORDER — ALLOPURINOL 300 MG PO TABS: 300.0000 mg | ORAL_TABLET | Freq: Every day | ORAL | 1 refills | Status: DC

## 2018-04-18 MED ORDER — PRAVASTATIN SODIUM 40 MG PO TABS: 40.0000 mg | ORAL_TABLET | Freq: Every day | ORAL | 1 refills | Status: DC

## 2018-04-18 MED ORDER — LISINOPRIL-HYDROCHLOROTHIAZIDE 20-25 MG PO TABS: 1.0000 | ORAL_TABLET | Freq: Every day | ORAL | 1 refills | Status: DC

## 2018-04-18 NOTE — Addendum Note (Signed)
Addended by: Gena Fray E on: 04/18/2018 04:52 PM   Modules accepted: Orders

## 2018-04-18 NOTE — Progress Notes (Signed)
Triglycerides are a little high - this can be due to not fasting  Recommend Mediterranean diet, regular aerobic exercise and weight loss Uric acid is 8, goal is less than 6. If he is having any joint pain in the area of his gout attacks, recommend we increase Allopurinol

## 2018-04-20 ENCOUNTER — Telehealth: Payer: Self-pay | Admitting: Physician Assistant

## 2018-04-23 ENCOUNTER — Encounter: Payer: Self-pay | Admitting: Family Medicine

## 2018-04-26 ENCOUNTER — Ambulatory Visit (INDEPENDENT_AMBULATORY_CARE_PROVIDER_SITE_OTHER): Payer: 59 | Admitting: Physician Assistant

## 2018-04-26 ENCOUNTER — Encounter: Payer: Self-pay | Admitting: Physician Assistant

## 2018-04-26 VITALS — BP 125/82 | HR 79 | Wt 245.0 lb

## 2018-04-26 DIAGNOSIS — E6609 Other obesity due to excess calories: Secondary | ICD-10-CM

## 2018-04-26 DIAGNOSIS — M1A0710 Idiopathic chronic gout, right ankle and foot, without tophus (tophi): Secondary | ICD-10-CM

## 2018-04-26 DIAGNOSIS — Z0000 Encounter for general adult medical examination without abnormal findings: Secondary | ICD-10-CM | POA: Diagnosis not present

## 2018-04-26 MED ORDER — ALLOPURINOL 100 MG PO TABS: 100.0000 mg | ORAL_TABLET | Freq: Every day | ORAL | 1 refills | Status: DC

## 2018-04-26 NOTE — Progress Notes (Signed)
HPI:                                                                Brandon Sandoval is a 62 y.o. male who presents to [facility]: Primary Care Sports Medicine today for annual physical exam  He would like to switch his Allopurinol prescription back to 100 mg daily. His uric acid was 8.0, but his gout is well-controlled.  Depression screen [facility] 2/9 04/01/2017 12/29/2016 06/11/2016 03/23/2016 01/07/2016  Decreased Interest 0 0 0 0 0  Down, Depressed, Hopeless 0 0 0 0 0  PHQ - 2 Score 0 0 0 0 0    No flowsheet data found.    Past Medical History:  Diagnosis Date  . Burn    RIGHT antecubital fossa  . Gout   . Hyperlipidemia   . Hypertension    Past Surgical History:  Procedure Laterality Date  . NO PAST SURGERIES     Social History   Tobacco Use  . Smoking status: Never Smoker  . Smokeless tobacco: Never Used  Substance Use Topics  . Alcohol use: No    Alcohol/week: 0.0 oz   family history includes Diabetes in his mother; Heart disease in his brother, father, and mother; Hypertension in his father; Kidney failure in his father.    ROS: negative except as noted in the HPI  Medications: Current Outpatient Medications  Medication Sig Dispense Refill  . allopurinol (ZYLOPRIM) 300 MG tablet Take 1 tablet (300 mg total) by mouth daily. 90 tablet 1  . aspirin 81 MG tablet Take 81 mg by mouth daily.    Marland Kitchen lisinopril-hydrochlorothiazide (PRINZIDE,ZESTORETIC) 20-25 MG tablet Take 1 tablet by mouth daily. 90 tablet 1  . pravastatin (PRAVACHOL) 40 MG tablet Take 1 tablet (40 mg total) by mouth daily. 90 tablet 1   No current facility-administered medications for this visit.    No Known Allergies     Objective:  BP 125/82   Pulse 79   Wt 245 lb (111.1 kg)   BMI 33.23 kg/m  General Appearance:  Alert, cooperative, no distress, appropriate for age, obese male                            Head:  Normocephalic, without obvious abnormality     Eyes:  PERRL, EOM's intact, conjunctiva and cornea clear                             Ears:  TM pearly gray color and semitransparent, external ear canals normal, both ears                            Nose:  Nares symmetrical                          Throat:  Lips, tongue, and mucosa are moist, pink, and intact; upper dental bridges and fillings                             Neck:  Supple; symmetrical, trachea midline, no adenopathy;  thyroid: no enlargement, symmetric, no tenderness/mass/nodules                             Back:  Symmetrical, no curvature, ROM normal                                         Lungs:  Clear to auscultation bilaterally, respirations unlabored                             Heart:  regular rate & normal rhythm, S1 and S2 normal, no murmurs, rubs, or gallops                     Abdomen:  Soft, non-tender, no mass or organomegaly              Genitourinary:  deferred         Musculoskeletal:  Tone and strength strong and symmetrical, all extremities; no joint pain or edema, normal gait and station                                   Lymphatic:  No adenopathy             Skin/Hair/Nails:  Skin warm, dry and intact, no rashes or abnormal dyspigmentation on limited exam                   Neurologic:  Alert and oriented x3, no cranial nerve deficits,sensation grossly intact, normal gait and station, no tremor Psych: well-groomed, cooperative, good eye contact, euthymic mood, affect mood-congruent, speech is articulate, and thought processes clear and goal-directed    No results found for this or any previous visit (from the past 72 hour(s)). No results found.    Assessment and Plan: 62 y.o. male with   Encounter for annual physical exam  Idiopathic chronic gout of right ankle without tophus - Plan: allopurinol (ZYLOPRIM) 100 MG tablet  Class 1 obesity due to excess calories with serious comorbidity in adult, unspecified BMI  - Personally reviewed PMH, PSH, PFH,  medications, allergies, HM - Age-appropriate cancer screening: Colonoscopy UTD, due 2020 - Tdap UTD - Provided with information on Shingrix - counseled on weight loss through decreased caloric intake and increased aerobic exercise - heart healthy DASH or Mediterranean diet - BP in range. Continue daily meds - reviewed fasting lab results with patient. Allopurinol switched back to 100 mg daily  Patient education and anticipatory guidance given Patient agrees with treatment plan Follow-up in 6 months for medication management or sooner as needed if symptoms worsen or fail to improve  Levonne Hubert PA-C

## 2018-04-26 NOTE — Patient Instructions (Signed)
Preventive Care 40-64 Years, Male Preventive care refers to lifestyle choices and visits with your health care provider that can promote health and wellness. What does preventive care include?  A yearly physical exam. This is also called an annual well check.  Dental exams once or twice a year.  Routine eye exams. Ask your health care provider how often you should have your eyes checked.  Personal lifestyle choices, including: ? Daily care of your teeth and gums. ? Regular physical activity. ? Eating a healthy diet. ? Avoiding tobacco and drug use. ? Limiting alcohol use. ? Practicing safe sex. ? Taking low-dose aspirin every day starting at age 60. What happens during an annual well check? The services and screenings done by your health care provider during your annual well check will depend on your age, overall health, lifestyle risk factors, and family history of disease. Counseling Your health care provider may ask you questions about your:  Alcohol use.  Tobacco use.  Drug use.  Emotional well-being.  Home and relationship well-being.  Sexual activity.  Eating habits.  Work and work Statistician.  Screening You may have the following tests or measurements:  Height, weight, and BMI.  Blood pressure.  Lipid and cholesterol levels. These may be checked every 5 years, or more frequently if you are over 60 years old.  Skin check.  Lung cancer screening. You may have this screening every year starting at age 60 if you have a 30-pack-year history of smoking and currently smoke or have quit within the past 15 years.  Fecal occult blood test (FOBT) of the stool. You may have this test every year starting at age 60.  Flexible sigmoidoscopy or colonoscopy. You may have a sigmoidoscopy every 5 years or a colonoscopy every 10 years starting at age 60.  Prostate cancer screening. Recommendations will vary depending on your family history and other risks.  Hepatitis C  blood test.  Hepatitis B blood test.  Sexually transmitted disease (STD) testing.  Diabetes screening. This is done by checking your blood sugar (glucose) after you have not eaten for a while (fasting). You may have this done every 1-3 years.  Discuss your test results, treatment options, and if necessary, the need for more tests with your health care provider. Vaccines Your health care provider may recommend certain vaccines, such as:  Influenza vaccine. This is recommended every year.  Tetanus, diphtheria, and acellular pertussis (Tdap, Td) vaccine. You may need a Td booster every 10 years.  Varicella vaccine. You may need this if you have not been vaccinated.  Zoster vaccine. You may need this after age 60.  Measles, mumps, and rubella (MMR) vaccine. You may need at least one dose of MMR if you were born in 1957 or later. You may also need a second dose.  Pneumococcal 13-valent conjugate (PCV13) vaccine. You may need this if you have certain conditions and have not been vaccinated.  Pneumococcal polysaccharide (PPSV23) vaccine. You may need one or two doses if you smoke cigarettes or if you have certain conditions.  Meningococcal vaccine. You may need this if you have certain conditions.  Hepatitis A vaccine. You may need this if you have certain conditions or if you travel or work in places where you may be exposed to hepatitis A.  Hepatitis B vaccine. You may need this if you have certain conditions or if you travel or work in places where you may be exposed to hepatitis B.  Haemophilus influenzae type b (Hib) vaccine.  You may need this if you have certain risk factors.  Talk to your health care provider about which screenings and vaccines you need and how often you need them. This information is not intended to replace advice given to you by your health care provider. Make sure you discuss any questions you have with your health care provider. Document Released: 12/11/2015  Document Revised: 08/03/2016 Document Reviewed: 09/15/2015 Elsevier Interactive Patient Education  [employer].

## 2018-04-27 ENCOUNTER — Encounter: Payer: Self-pay | Admitting: Physician Assistant

## 2018-07-05 NOTE — Telephone Encounter (Signed)
Error

## 2018-10-18 ENCOUNTER — Ambulatory Visit (INDEPENDENT_AMBULATORY_CARE_PROVIDER_SITE_OTHER): Payer: BLUE CROSS/BLUE SHIELD | Admitting: Physician Assistant

## 2018-10-18 ENCOUNTER — Encounter: Payer: Self-pay | Admitting: Physician Assistant

## 2018-10-18 VITALS — BP 110/81 | HR 100 | Wt 251.0 lb

## 2018-10-18 DIAGNOSIS — E7800 Pure hypercholesterolemia, unspecified: Secondary | ICD-10-CM

## 2018-10-18 DIAGNOSIS — I10 Essential (primary) hypertension: Secondary | ICD-10-CM

## 2018-10-18 DIAGNOSIS — M1A0710 Idiopathic chronic gout, right ankle and foot, without tophus (tophi): Secondary | ICD-10-CM | POA: Diagnosis not present

## 2018-10-18 DIAGNOSIS — Z79899 Other long term (current) drug therapy: Secondary | ICD-10-CM

## 2018-10-18 MED ORDER — PRAVASTATIN SODIUM 40 MG PO TABS: 40.0000 mg | ORAL_TABLET | Freq: Every day | ORAL | 1 refills | Status: DC

## 2018-10-18 MED ORDER — ALLOPURINOL 100 MG PO TABS: 100.0000 mg | ORAL_TABLET | Freq: Every day | ORAL | 1 refills | Status: DC

## 2018-10-18 MED ORDER — LISINOPRIL-HYDROCHLOROTHIAZIDE 20-25 MG PO TABS: 1.0000 | ORAL_TABLET | Freq: Every day | ORAL | 1 refills | Status: DC
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
## 2018-10-18 NOTE — Patient Instructions (Addendum)
For your blood pressure: - Goal <130/80 (Ideally 120's/70's) - take your blood pressure medication in the morning (unless instructed differently) - take baby aspirin 81 mg daily to help prevent heart attack/stroke - monitor and log blood pressures at home - check around the same time each day in a relaxed setting - Limit salt to <2500 mg/day - Follow DASH (Dietary Approach to Stopping Hypertension) eating plan - Try to get at least 150 minutes of aerobic exercise per week - Aim to go on a brisk walk 30 minutes per day at least 5 days per week. If you're not active, gradually increase how long you walk by 5 minutes each week - limit alcohol: 2 standard drinks per day for men and 1 per day for women - avoid tobacco/nicotine products. Consider smoking cessation if you smoke - weight loss: 7% of current body weight can reduce your blood pressure by 5-10 points - follow-up at least every 6 months for your blood pressure. Follow-up sooner if your BP is not controlled  Food Choices to Lower Your Triglycerides Triglycerides are a type of fat in your blood. High levels of triglycerides can increase the risk of heart disease and stroke. If your triglyceride levels are high, the foods you eat and your eating habits are very important. Choosing the right foods can help lower your triglycerides. What general guidelines do I need to follow?  Lose weight if you are overweight.  Limit or avoid alcohol.  Fill one half of your plate with vegetables and green salads.  Limit fruit to two servings a day. Choose fruit instead of juice.  Make one fourth of your plate whole grains. Look for the word "whole" as the first word in the ingredient list.  Fill one fourth of your plate with lean protein foods.  Enjoy fatty fish (such as salmon, mackerel, sardines, and tuna) three times a week.  Choose healthy fats.  Limit foods high in starch and sugar.  Eat more home-cooked food and less restaurant, buffet,  and fast food.  Limit fried foods.  Cook foods using methods other than frying.  Limit saturated fats.  Check ingredient lists to avoid foods with partially hydrogenated oils (trans fats) in them. What foods can I eat? Grains Whole grains, such as whole wheat or whole grain breads, crackers, cereals, and pasta. Unsweetened oatmeal, bulgur, barley, quinoa, or brown rice. Corn or whole wheat flour tortillas. Vegetables Fresh or frozen vegetables (raw, steamed, roasted, or grilled). Green salads. Fruits All fresh, canned (in natural juice), or frozen fruits. Meat and Other Protein Products Ground beef (85% or leaner), grass-fed beef, or beef trimmed of fat. Skinless chicken or [place]. Ground chicken or [place]. Pork trimmed of fat. All fish and seafood. Eggs. Dried beans, peas, or lentils. Unsalted nuts or seeds. Unsalted canned or dry beans. Dairy Low-fat dairy products, such as skim or 1% milk, 2% or reduced-fat cheeses, low-fat ricotta or cottage cheese, or plain low-fat yogurt. Fats and Oils Tub margarines without trans fats. Light or reduced-fat mayonnaise and salad dressings. Avocado. Safflower, olive, or canola oils. Natural peanut or almond butter. The items listed above may not be a complete list of recommended foods or beverages. Contact your dietitian for more options. What foods are not recommended? Grains White bread. White pasta. White rice. Cornbread. Bagels, pastries, and croissants. Crackers that contain trans fat. Vegetables White potatoes. Corn. Creamed or fried vegetables. Vegetables in a cheese sauce. Fruits Dried fruits. Canned fruit in light or heavy syrup. Fruit  juice. Meat and Other Protein Products Fatty cuts of meat. Ribs, chicken wings, bacon, sausage, bologna, salami, chitterlings, fatback, hot dogs, bratwurst, and packaged luncheon meats. Dairy Whole or 2% milk, cream, half-and-half, and cream cheese. Whole-fat or sweetened yogurt. Full-fat cheeses. Nondairy  creamers and whipped toppings. Processed cheese, cheese spreads, or cheese curds. Sweets and Desserts Corn syrup, sugars, honey, and molasses. Candy. Jam and jelly. Syrup. Sweetened cereals. Cookies, pies, cakes, donuts, muffins, and ice cream. Fats and Oils Butter, stick margarine, lard, shortening, ghee, or bacon fat. Coconut, palm kernel, or palm oils. Beverages Alcohol. Sweetened drinks (such as sodas, lemonade, and fruit drinks or punches). The items listed above may not be a complete list of foods and beverages to avoid. Contact your dietitian for more information. This information is not intended to replace advice given to you by your health care provider. Make sure you discuss any questions you have with your health care provider. Document Released: 09/01/2004 Document Revised: 04/21/2016 Document Reviewed: 09/18/2013 Elsevier Interactive Patient Education  2017 [employer].

## 2018-12-06 ENCOUNTER — Encounter: Payer: Self-pay | Admitting: Emergency Medicine

## 2018-12-06 ENCOUNTER — Other Ambulatory Visit: Payer: Self-pay

## 2018-12-06 ENCOUNTER — Ambulatory Visit: Payer: Self-pay | Admitting: Emergency Medicine

## 2018-12-06 VITALS — BP 116/73 | HR 71 | Temp 98.4°F | Resp 16 | Wt 250.6 lb

## 2018-12-06 DIAGNOSIS — Z024 Encounter for examination for driving license: Secondary | ICD-10-CM

## 2018-12-06 NOTE — Progress Notes (Signed)
BP Readings from Last 3 Encounters:  12/06/18 116/73  10/18/18 110/81  04/26/18 125/82       This patient presents for DOT examination for fitness for duty.   Medical History:  1. Head/Brain Injuries, disorders or illnesses no  2. Seizures, epilepsy no  3. Eye disorders or impaired vision (except corrective lenses) no  4. Ear disorders, loss of hearing or balance no  5. Heart disease or heart attack, other cardiovascular condition no  6. Heart surgery (valve replacement/bypass, angioplasty, pacemaker/defribrillator) no  7. High blood pressure yes  8. High cholesterol yes  9. Chronic cough, shortness of breath or other breathing problems no  10. Lung disease (emphysema, asthma or chronic bronchitis) no  11. Kidney disease, dialysis no  12. Digestive problems  no  13. Diabetes or elevated blood sugar no                      If yes to #13, Insulin use n/a  14. Nervious or psychiatric disorders, e.g., severe depression no  15. Fainting or syncope no  16. Dizziness, headaches, numbness, tingling or memory loss no  17. Unexplained weight loss no  18. Stroke, TIA or paralysis no  19. Missing or impaired hand, arm, foot, leg, finger, toe no  20. Spinal injury or disease no  21. Bone, muscles or nerve problems no  22. Blood clots or bleeding bleeding disorders no  23. Cancer no  24. Chronic infection or other chronic diseases no  25. Sleep disorders, pauses in breathing while asleep, daytime sleepiness, loud snoring no  26. Have you ever had a sleep test? no  27.  Have you ever spent a night in the hospital? no  28. Have you ever had a broken bone? no  29. Have you or or do you use tobacco products? no  30. Regular, frequent alcohol use no  31. Illegal substance use within the past 2 years no  32.  Have you ever failed a drug test or been dependent on an illegal substance? no   Current Medications: Prior to Admission medications   Medication Sig Start Date End Date Taking?  Authorizing Provider  allopurinol (ZYLOPRIM) 100 MG tablet Take 1 tablet (100 mg total) by mouth daily. 10/18/18  Yes Carlis Stableummings, Charley Elizabeth, PA-C  aspirin 81 MG tablet Take 81 mg by mouth daily.   Yes [provider]  lisinopril-hydrochlorothiazide (PRINZIDE,ZESTORETIC) 20-25 MG tablet Take 1 tablet by mouth daily. 10/18/18  Yes Carlis Stableummings, Charley Elizabeth, PA-C  pravastatin (PRAVACHOL) 40 MG tablet Take 1 tablet (40 mg total) by mouth daily. 10/18/18  Yes Carlis Stableummings, Charley Elizabeth, PA-C    Medical Examiner's Comments on Health History:  Stable chronic medical problems.  TESTING:   Visual Acuity Screening   Right eye Left eye Both eyes  Without correction: 20/20 20/20 20/20   With correction:     Comments: Colors: 6/6 Periph: R  85 degrees  L 85 degrees  Hearing Screening Comments: Whisper test: R ear-10 ft    L ear-10 ft   Monocular Vision: No.  Hearing Aid used for test: No. Hearing Aid required to to meet standard: No.  BP 116/73   Pulse 71   Temp 98.4 F (36.9 C) (Oral)   Resp 16   Wt 250 lb 9.6 oz (113.7 kg)   HC 71" (180.3 cm)   SpO2 96%   PF 96 L/min   BMI 33.99 kg/m  Pulse rate is regular  Comments: u/a: blood-trace, protein-negative,  spg-1.010, glucose-negative  PHYSICAL EXAMINATION:  General Appearance Not markedly obese. No tremor, signs of alcoholism, problem drinking or drug abuse.   Skin Warm, dry and intact.   Eyes Pupils are equal, round and reactive to light and accommodation, extraocular movements are intact. No exophthalmos, no nystagmus.  Ears Normal external ears. External canal without occlusion. No scarring of the TM. No perforation of the TM.  Mouth and Throat Clear and moist. No irremedial deformities likely to interfere with breathing or swallowing.  Heart No murmurs, extra sounds, evidence of cardiomegaly. No pacemaker. No implantable defibrillator.  Lungs and Chest (excluding breasts) Normal chest expansion, respiratory  rate, breath sounds. No cyanosis.  Abdomen and Viscera No liver enlargement. No splenic enlargement. No masses, bruits, hernias or significant abdominal wall weakness.  Genitourinary  No inguinal or femoral hernia.  Spine and other musculoskeletal No tenderness, no limitation of motion, no deformities. No evidence of previous surgery.  Extremities No loss or impairment of leg, foot, toe, arm, hand, finger. No perceptible limp, deformities, atrophy, weakness, paralysis, clubbing, edema, hypotonia. Patient has sufficient grasp and prehension to maintain steering wheel grip. Patient has sufficient mobility and strength in the lower limbs to operate pedals properly.  Neurologic Normal equilibrium, coordination, speech pattern. No paresthesia, asymmetry of deep tendon reflexes, sensory or positional abnormalities. No abnormality of patellar or Babinski's reflexes.  Gait Not antalgic or ataxic  Vascular Normal pulses. No carotid or arterial bruits. No varicose veins.     Certification Status:  Meets standards, but periodic monitoring required due to: HTN  Driver qualified only for: 1 year      Certification expires 12/07/2019

## 2018-12-06 NOTE — Patient Instructions (Addendum)

## 2019-04-15 ENCOUNTER — Other Ambulatory Visit: Payer: Self-pay | Admitting: Physician Assistant

## 2019-04-15 DIAGNOSIS — I10 Essential (primary) hypertension: Secondary | ICD-10-CM

## 2019-04-15 DIAGNOSIS — M1A0710 Idiopathic chronic gout, right ankle and foot, without tophus (tophi): Secondary | ICD-10-CM

## 2019-04-18 ENCOUNTER — Encounter: Payer: Self-pay | Admitting: Physician Assistant

## 2019-04-18 ENCOUNTER — Ambulatory Visit (INDEPENDENT_AMBULATORY_CARE_PROVIDER_SITE_OTHER): Payer: BLUE CROSS/BLUE SHIELD | Admitting: Physician Assistant

## 2019-04-18 VITALS — BP 125/82 | HR 90 | Temp 98.2°F | Wt 247.0 lb

## 2019-04-18 DIAGNOSIS — Z5181 Encounter for therapeutic drug level monitoring: Secondary | ICD-10-CM

## 2019-04-18 DIAGNOSIS — Z8739 Personal history of other diseases of the musculoskeletal system and connective tissue: Secondary | ICD-10-CM

## 2019-04-18 DIAGNOSIS — N289 Disorder of kidney and ureter, unspecified: Secondary | ICD-10-CM

## 2019-04-18 DIAGNOSIS — Z79899 Other long term (current) drug therapy: Secondary | ICD-10-CM

## 2019-04-18 DIAGNOSIS — Z130 Encounter for screening for diseases of the blood and blood-forming organs and certain disorders involving the immune mechanism: Secondary | ICD-10-CM

## 2019-04-18 DIAGNOSIS — R7989 Other specified abnormal findings of blood chemistry: Secondary | ICD-10-CM

## 2019-04-18 DIAGNOSIS — Z125 Encounter for screening for malignant neoplasm of prostate: Secondary | ICD-10-CM | POA: Diagnosis not present

## 2019-04-18 DIAGNOSIS — I10 Essential (primary) hypertension: Secondary | ICD-10-CM

## 2019-04-18 DIAGNOSIS — M1A0710 Idiopathic chronic gout, right ankle and foot, without tophus (tophi): Secondary | ICD-10-CM

## 2019-04-18 DIAGNOSIS — E7800 Pure hypercholesterolemia, unspecified: Secondary | ICD-10-CM | POA: Diagnosis not present

## 2019-04-18 MED ORDER — PRAVASTATIN SODIUM 40 MG PO TABS: 40.0000 mg | ORAL_TABLET | Freq: Every day | ORAL | 1 refills | Status: DC

## 2019-04-18 MED ORDER — LISINOPRIL-HYDROCHLOROTHIAZIDE 20-25 MG PO TABS: 1.0000 | ORAL_TABLET | Freq: Every day | ORAL | 1 refills | Status: DC

## 2019-04-18 MED ORDER — ALLOPURINOL 100 MG PO TABS: 100.0000 mg | ORAL_TABLET | Freq: Every day | ORAL | 1 refills | Status: DC

## 2019-04-18 NOTE — Patient Instructions (Signed)
For your blood pressure: - Goal <130/80 (Ideally 120's/70's) - take your blood pressure medication in the morning (unless instructed differently) - take baby aspirin 81 mg daily to help prevent heart attack/stroke - monitor and log blood pressures at home - check around the same time each day in a relaxed setting - Limit salt to <2500 mg/day - Follow DASH (Dietary Approach to Stopping Hypertension) eating plan - Try to get at least 150 minutes of aerobic exercise per week - Aim to go on a brisk walk 30 minutes per day at least 5 days per week. If you're not active, gradually increase how long you walk by 5 minutes each week - limit alcohol: 2 standard drinks per day for men and 1 per day for women - avoid tobacco/nicotine products. Consider smoking cessation if you smoke - weight loss: 7% of current body weight can reduce your blood pressure by 5-10 points - follow-up at least every 6 months for your blood pressure. Follow-up sooner if your BP is not controlled   DASH Eating Plan DASH stands for "Dietary Approaches to Stop Hypertension." The DASH eating plan is a healthy eating plan that has been shown to reduce high blood pressure (hypertension). It may also reduce your risk for type 2 diabetes, heart disease, and stroke. The DASH eating plan may also help with weight loss. What are tips for following this plan?  General guidelines  Avoid eating more than 2,300 mg (milligrams) of salt (sodium) a day. If you have hypertension, you may need to reduce your sodium intake to 1,500 mg a day.  Limit alcohol intake to no more than 1 drink a day for nonpregnant women and 2 drinks a day for men. One drink equals 12 oz of beer, 5 oz of wine, or 1 oz of hard liquor.  Work with your health care provider to maintain a healthy body weight or to lose weight. Ask what an ideal weight is for you.  Get at least 30 minutes of exercise that causes your heart to beat faster (aerobic exercise) most days of  the week. Activities may include walking, swimming, or biking.  Work with your health care provider or diet and nutrition specialist (dietitian) to adjust your eating plan to your individual calorie needs. Reading food labels   Check food labels for the amount of sodium per serving. Choose foods with less than 5 percent of the Daily Value of sodium. Generally, foods with less than 300 mg of sodium per serving fit into this eating plan.  To find whole grains, look for the word "whole" as the first word in the ingredient list. Shopping  Buy products labeled as "low-sodium" or "no salt added."  Buy fresh foods. Avoid canned foods and premade or frozen meals. Cooking  Avoid adding salt when cooking. Use salt-free seasonings or herbs instead of table salt or sea salt. Check with your health care provider or pharmacist before using salt substitutes.  Do not fry foods. Cook foods using healthy methods such as baking, boiling, grilling, and broiling instead.  Cook with heart-healthy oils, such as olive, canola, soybean, or sunflower oil. Meal planning  Eat a balanced diet that includes: ? 5 or more servings of fruits and vegetables each day. At each meal, try to fill half of your plate with fruits and vegetables. ? Up to 6-8 servings of whole grains each day. ? Less than 6 oz of lean meat, poultry, or fish each day. A 3-oz serving of meat is about   the same size as a deck of cards. One egg equals 1 oz. ? 2 servings of low-fat dairy each day. ? A serving of nuts, seeds, or beans 5 times each week. ? Heart-healthy fats. Healthy fats called Omega-3 fatty acids are found in foods such as flaxseeds and coldwater fish, like sardines, salmon, and mackerel.  Limit how much you eat of the following: ? Canned or prepackaged foods. ? Food that is high in trans fat, such as fried foods. ? Food that is high in saturated fat, such as fatty meat. ? Sweets, desserts, sugary drinks, and other foods with  added sugar. ? Full-fat dairy products.  Do not salt foods before eating.  Try to eat at least 2 vegetarian meals each week.  Eat more home-cooked food and less restaurant, buffet, and fast food.  When eating at a restaurant, ask that your food be prepared with less salt or no salt, if possible. What foods are recommended? The items listed may not be a complete list. Talk with your dietitian about what dietary choices are best for you. Grains Whole-grain or whole-wheat bread. Whole-grain or whole-wheat pasta. Brown rice. Oatmeal. Quinoa. Bulgur. Whole-grain and low-sodium cereals. Pita bread. Low-fat, low-sodium crackers. Whole-wheat flour tortillas. Vegetables Fresh or frozen vegetables (raw, steamed, roasted, or grilled). Low-sodium or reduced-sodium tomato and vegetable juice. Low-sodium or reduced-sodium tomato sauce and tomato paste. Low-sodium or reduced-sodium canned vegetables. Fruits All fresh, dried, or frozen fruit. Canned fruit in natural juice (without added sugar). Meat and other protein foods Skinless chicken or turkey. Ground chicken or turkey. Pork with fat trimmed off. Fish and seafood. Egg whites. Dried beans, peas, or lentils. Unsalted nuts, nut butters, and seeds. Unsalted canned beans. Lean cuts of beef with fat trimmed off. Low-sodium, lean deli meat. Dairy Low-fat (1%) or fat-free (skim) milk. Fat-free, low-fat, or reduced-fat cheeses. Nonfat, low-sodium ricotta or cottage cheese. Low-fat or nonfat yogurt. Low-fat, low-sodium cheese. Fats and oils Soft margarine without trans fats. Vegetable oil. Low-fat, reduced-fat, or light mayonnaise and salad dressings (reduced-sodium). Canola, safflower, olive, soybean, and sunflower oils. Avocado. Seasoning and other foods Herbs. Spices. Seasoning mixes without salt. Unsalted popcorn and pretzels. Fat-free sweets. What foods are not recommended? The items listed may not be a complete list. Talk with your dietitian about what  dietary choices are best for you. Grains Baked goods made with fat, such as croissants, muffins, or some breads. Dry pasta or rice meal packs. Vegetables Creamed or fried vegetables. Vegetables in a cheese sauce. Regular canned vegetables (not low-sodium or reduced-sodium). Regular canned tomato sauce and paste (not low-sodium or reduced-sodium). Regular tomato and vegetable juice (not low-sodium or reduced-sodium). Pickles. Olives. Fruits Canned fruit in a light or heavy syrup. Fried fruit. Fruit in cream or butter sauce. Meat and other protein foods Fatty cuts of meat. Ribs. Fried meat. Bacon. Sausage. Bologna and other processed lunch meats. Salami. Fatback. Hotdogs. Bratwurst. Salted nuts and seeds. Canned beans with added salt. Canned or smoked fish. Whole eggs or egg yolks. Chicken or turkey with skin. Dairy Whole or 2% milk, cream, and half-and-half. Whole or full-fat cream cheese. Whole-fat or sweetened yogurt. Full-fat cheese. Nondairy creamers. Whipped toppings. Processed cheese and cheese spreads. Fats and oils Butter. Stick margarine. Lard. Shortening. Ghee. Bacon fat. Tropical oils, such as coconut, palm kernel, or palm oil. Seasoning and other foods Salted popcorn and pretzels. Onion salt, garlic salt, seasoned salt, table salt, and sea salt. Worcestershire sauce. Tartar sauce. Barbecue sauce. Teriyaki sauce. Soy sauce,   including reduced-sodium. Steak sauce. Canned and packaged gravies. Fish sauce. Oyster sauce. Cocktail sauce. Horseradish that you find on the shelf. Ketchup. Mustard. Meat flavorings and tenderizers. Bouillon cubes. Hot sauce and Tabasco sauce. Premade or packaged marinades. Premade or packaged taco seasonings. Relishes. Regular salad dressings. Where to find more information:  National Heart, Lung, and Blood Institute: www.nhlbi.nih.gov  American Heart Association: www.heart.org Summary  The DASH eating plan is a healthy eating plan that has been shown to reduce  high blood pressure (hypertension). It may also reduce your risk for type 2 diabetes, heart disease, and stroke.  With the DASH eating plan, you should limit salt (sodium) intake to 2,300 mg a day. If you have hypertension, you may need to reduce your sodium intake to 1,500 mg a day.  When on the DASH eating plan, aim to eat more fresh fruits and vegetables, whole grains, lean proteins, low-fat dairy, and heart-healthy fats.  Work with your health care provider or diet and nutrition specialist (dietitian) to adjust your eating plan to your individual calorie needs. This information is not intended to replace advice given to you by your health care provider. Make sure you discuss any questions you have with your health care provider. Document Released: 11/03/2011 Document Revised: 11/07/2016 Document Reviewed: 11/07/2016 Elsevier Interactive Patient Education  2019 [employer]  
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
## 2019-04-19 ENCOUNTER — Encounter: Payer: Self-pay | Admitting: Physician Assistant

## 2019-04-19 ENCOUNTER — Ambulatory Visit: Payer: BLUE CROSS/BLUE SHIELD | Admitting: Physician Assistant

## 2019-04-19 DIAGNOSIS — R7989 Other specified abnormal findings of blood chemistry: Secondary | ICD-10-CM | POA: Insufficient documentation

## 2019-04-19 DIAGNOSIS — N289 Disorder of kidney and ureter, unspecified: Secondary | ICD-10-CM

## 2019-04-19 DIAGNOSIS — E790 Hyperuricemia without signs of inflammatory arthritis and tophaceous disease: Secondary | ICD-10-CM | POA: Insufficient documentation

## 2019-04-19 HISTORY — DX: Hyperuricemia without signs of inflammatory arthritis and tophaceous disease: E79.0

## 2019-04-19 HISTORY — DX: Disorder of kidney and ureter, unspecified: N28.9

## 2019-04-19 LAB — COMPLETE METABOLIC PANEL WITH GFR
AG Ratio: 1.4 (calc) (ref 1.0–2.5)
ALT: 33 U/L (ref 9–46)
AST: 22 U/L (ref 10–35)
Albumin: 4.5 g/dL (ref 3.6–5.1)
Alkaline phosphatase (APISO): 71 U/L (ref 35–144)
BUN/Creatinine Ratio: 14 (calc) (ref 6–22)
BUN: 19 mg/dL (ref 7–25)
CO2: 27 mmol/L (ref 20–32)
Calcium: 9.8 mg/dL (ref 8.6–10.3)
Chloride: 103 mmol/L (ref 98–110)
Creat: 1.34 mg/dL — ABNORMAL HIGH (ref 0.70–1.25)
GFR, Est African American: 66 mL/min/{1.73_m2} (ref >=60)
GFR, Est Non African American: 57 mL/min/{1.73_m2} — ABNORMAL LOW (ref >=60)
Globulin: 3.2 g/dL (calc) (ref 1.9–3.7)
Glucose, Bld: 91 mg/dL (ref 65–99)
Potassium: 3.9 mmol/L (ref 3.5–5.3)
Sodium: 138 mmol/L (ref 135–146)
Total Bilirubin: 0.5 mg/dL (ref 0.2–1.2)
Total Protein: 7.7 g/dL (ref 6.1–8.1)

## 2019-04-19 LAB — CBC
HCT: 42.1 % (ref 38.5–50.0)
Hemoglobin: 14.6 g/dL (ref 13.2–17.1)
MCH: 32.2 pg (ref 27.0–33.0)
MCHC: 34.7 g/dL (ref 32.0–36.0)
MCV: 92.7 fL (ref 80.0–100.0)
MPV: 10.7 fL (ref 7.5–12.5)
Platelets: 312 10*3/uL (ref 140–400)
RBC: 4.54 10*6/uL (ref 4.20–5.80)
RDW: 11.9 % (ref 11.0–15.0)
WBC: 7.2 10*3/uL (ref 3.8–10.8)

## 2019-04-19 LAB — LIPID PANEL W/REFLEX DIRECT LDL
Cholesterol: 192 mg/dL (ref <200)
HDL: 44 mg/dL (ref >=40)
LDL Cholesterol (Calc): 120 mg/dL (calc) — ABNORMAL HIGH
Non-HDL Cholesterol (Calc): 148 mg/dL (calc) — ABNORMAL HIGH (ref <130)
Total CHOL/HDL Ratio: 4.4 (calc) (ref <5.0)
Triglycerides: 164 mg/dL — ABNORMAL HIGH (ref <150)

## 2019-04-19 LAB — URIC ACID: Uric Acid, Serum: 8.4 mg/dL — ABNORMAL HIGH (ref 4.0–8.0)

## 2019-04-19 LAB — PSA: PSA: 0.5 ng/mL (ref <=4.0)

## 2019-04-19 NOTE — Progress Notes (Signed)
Uric acid level is high. Recommend increasing allopurinol to 300 mg daily.   Kidney function is a little reduced.  Recommend rechecking in 1 month LDL cholesterol has increased a little bit compared to last year.  Recommend increasing pravastatin to 80 mg daily.  Please confirm that patient is agreeable to an increased dose of these medications

## 2019-04-19 NOTE — Addendum Note (Signed)
Addended by: Levonne Hubert on: 04/19/2019 08:57 AM   Modules accepted: Orders

## 2019-04-23 ENCOUNTER — Telehealth: Payer: Self-pay | Admitting: Physician Assistant

## 2019-04-23 NOTE — Telephone Encounter (Signed)
Left message for patient to return clinic call regarding results, callback information provided.  

## 2019-04-23 NOTE — Telephone Encounter (Signed)
Thank you :)

## 2019-04-23 NOTE — Telephone Encounter (Signed)
Pt  lvm for test results.  Thanks.

## 2019-04-25 ENCOUNTER — Other Ambulatory Visit: Payer: Self-pay

## 2019-04-25 DIAGNOSIS — N289 Disorder of kidney and ureter, unspecified: Secondary | ICD-10-CM

## 2019-04-25 MED ORDER — ALLOPURINOL 300 MG PO TABS: 300.0000 mg | ORAL_TABLET | Freq: Every day | ORAL | 1 refills | Status: DC

## 2019-04-25 MED ORDER — PRAVASTATIN SODIUM 80 MG PO TABS: 80.0000 mg | ORAL_TABLET | Freq: Every day | ORAL | 1 refills | Status: DC

## 2019-04-25 NOTE — Telephone Encounter (Signed)
See result note.  

## 2019-04-25 NOTE — Addendum Note (Signed)
Addended by: Gena Fray E on: 04/25/2019 10:23 AM   Modules accepted: Orders

## 2019-05-27 DIAGNOSIS — N289 Disorder of kidney and ureter, unspecified: Secondary | ICD-10-CM | POA: Diagnosis not present

## 2019-05-28 LAB — COMPLETE METABOLIC PANEL WITH GFR
AG Ratio: 1.4 (calc) (ref 1.0–2.5)
ALT: 36 U/L (ref 9–46)
AST: 22 U/L (ref 10–35)
Albumin: 4.7 g/dL (ref 3.6–5.1)
Alkaline phosphatase (APISO): 70 U/L (ref 35–144)
BUN: 14 mg/dL (ref 7–25)
CO2: 26 mmol/L (ref 20–32)
Calcium: 10.1 mg/dL (ref 8.6–10.3)
Chloride: 106 mmol/L (ref 98–110)
Creat: 1.19 mg/dL (ref 0.70–1.25)
GFR, Est African American: 76 mL/min/{1.73_m2} (ref >=60)
GFR, Est Non African American: 66 mL/min/{1.73_m2} (ref >=60)
Globulin: 3.3 g/dL (calc) (ref 1.9–3.7)
Glucose, Bld: 84 mg/dL (ref 65–99)
Potassium: 3.8 mmol/L (ref 3.5–5.3)
Sodium: 142 mmol/L (ref 135–146)
Total Bilirubin: 0.7 mg/dL (ref 0.2–1.2)
Total Protein: 8 g/dL (ref 6.1–8.1)

## 2019-06-05 LAB — HM COLONOSCOPY

## 2019-06-06 DIAGNOSIS — Z1211 Encounter for screening for malignant neoplasm of colon: Secondary | ICD-10-CM | POA: Diagnosis not present

## 2019-06-21 ENCOUNTER — Encounter: Payer: Self-pay | Admitting: Physician Assistant

## 2019-08-06 ENCOUNTER — Other Ambulatory Visit: Payer: Self-pay | Admitting: Physician Assistant

## 2019-08-06 DIAGNOSIS — E7800 Pure hypercholesterolemia, unspecified: Secondary | ICD-10-CM

## 2019-10-17 ENCOUNTER — Encounter: Payer: Self-pay | Admitting: Osteopathic Medicine

## 2019-10-17 ENCOUNTER — Ambulatory Visit (INDEPENDENT_AMBULATORY_CARE_PROVIDER_SITE_OTHER): Payer: Self-pay | Admitting: Osteopathic Medicine

## 2019-10-17 ENCOUNTER — Ambulatory Visit: Payer: BLUE CROSS/BLUE SHIELD | Admitting: Physician Assistant

## 2019-10-17 DIAGNOSIS — M1A0710 Idiopathic chronic gout, right ankle and foot, without tophus (tophi): Secondary | ICD-10-CM

## 2019-10-17 DIAGNOSIS — E7800 Pure hypercholesterolemia, unspecified: Secondary | ICD-10-CM

## 2019-10-17 DIAGNOSIS — I10 Essential (primary) hypertension: Secondary | ICD-10-CM

## 2019-10-17 MED ORDER — LISINOPRIL-HYDROCHLOROTHIAZIDE 20-25 MG PO TABS: 1.0000 | ORAL_TABLET | Freq: Every day | ORAL | 3 refills | Status: DC

## 2019-10-17 MED ORDER — PRAVASTATIN SODIUM 80 MG PO TABS: 80.0000 mg | ORAL_TABLET | Freq: Every day | ORAL | 3 refills | Status: DC

## 2019-10-17 MED ORDER — ALLOPURINOL 300 MG PO TABS: 300.0000 mg | ORAL_TABLET | Freq: Every day | ORAL | 3 refills | Status: DC
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
## 2019-10-31 DIAGNOSIS — I10 Essential (primary) hypertension: Secondary | ICD-10-CM | POA: Diagnosis not present

## 2019-10-31 DIAGNOSIS — M1A0710 Idiopathic chronic gout, right ankle and foot, without tophus (tophi): Secondary | ICD-10-CM | POA: Diagnosis not present

## 2019-10-31 DIAGNOSIS — E7800 Pure hypercholesterolemia, unspecified: Secondary | ICD-10-CM | POA: Diagnosis not present

## 2019-11-01 LAB — COMPLETE METABOLIC PANEL WITH GFR
AG Ratio: 1.3 (calc) (ref 1.0–2.5)
ALT: 36 U/L (ref 9–46)
AST: 22 U/L (ref 10–35)
Albumin: 4.3 g/dL (ref 3.6–5.1)
Alkaline phosphatase (APISO): 79 U/L (ref 35–144)
BUN/Creatinine Ratio: 11 (calc) (ref 6–22)
BUN: 14 mg/dL (ref 7–25)
CO2: 26 mmol/L (ref 20–32)
Calcium: 9.9 mg/dL (ref 8.6–10.3)
Chloride: 104 mmol/L (ref 98–110)
Creat: 1.26 mg/dL — ABNORMAL HIGH (ref 0.70–1.25)
GFR, Est African American: 71 mL/min/{1.73_m2} (ref >=60)
GFR, Est Non African American: 61 mL/min/{1.73_m2} (ref >=60)
Globulin: 3.2 g/dL (calc) (ref 1.9–3.7)
Glucose, Bld: 94 mg/dL (ref 65–99)
Potassium: 4 mmol/L (ref 3.5–5.3)
Sodium: 140 mmol/L (ref 135–146)
Total Bilirubin: 0.6 mg/dL (ref 0.2–1.2)
Total Protein: 7.5 g/dL (ref 6.1–8.1)

## 2019-11-01 LAB — LIPID PANEL
Cholesterol: 164 mg/dL (ref <200)
HDL: 44 mg/dL (ref >=40)
LDL Cholesterol (Calc): 98 mg/dL (calc)
Non-HDL Cholesterol (Calc): 120 mg/dL (calc) (ref <130)
Total CHOL/HDL Ratio: 3.7 (calc) (ref <5.0)
Triglycerides: 120 mg/dL (ref <150)

## 2019-11-01 LAB — URIC ACID: Uric Acid, Serum: 6 mg/dL (ref 4.0–8.0)

## 2019-12-04 ENCOUNTER — Encounter: Payer: Self-pay | Admitting: Emergency Medicine

## 2019-12-04 ENCOUNTER — Other Ambulatory Visit: Payer: Self-pay

## 2019-12-04 ENCOUNTER — Ambulatory Visit: Payer: Self-pay | Admitting: Emergency Medicine

## 2019-12-04 VITALS — BP 116/81 | HR 100 | Temp 98.7°F | Resp 16 | Ht 72.0 in | Wt 252.0 lb

## 2019-12-04 DIAGNOSIS — Z024 Encounter for examination for driving license: Secondary | ICD-10-CM

## 2019-12-04 NOTE — Patient Instructions (Addendum)
   If you have lab work done today you will be contacted with your lab results within the next 2 weeks.  If you have not heard from us then please contact us. The fastest way to get your results is to register for My Chart.   IF you received an x-ray today, you will receive an invoice from [facility]. Please contact [facility] at [phone] with questions or concerns regarding your invoice.   IF you received labwork today, you will receive an invoice from [facility]. Please contact [facility] at [phone] with questions or concerns regarding your invoice.   Our billing staff will not be able to assist you with questions regarding bills from these companies.  You will be contacted with the lab results as soon as they are available. The fastest way to get your results is to activate your My Chart account. Instructions are located on the last page of this paperwork. If you have not heard from us regarding the results in 2 weeks, please contact this office.      Health Maintenance, Male Adopting a healthy lifestyle and getting preventive care are important in promoting health and wellness. Ask your health care provider about:  The right schedule for you to have regular tests and exams.  Things you can do on your own to prevent diseases and keep yourself healthy. What should I know about diet, weight, and exercise? Eat a healthy diet   Eat a diet that includes plenty of vegetables, fruits, low-fat dairy products, and lean protein.  Do not eat a lot of foods that are high in solid fats, added sugars, or sodium. Maintain a healthy weight Body mass index (BMI) is a measurement that can be used to identify possible weight problems. It estimates body fat based on height and weight. Your health care provider can help determine your BMI and help you achieve or maintain a healthy weight. Get regular exercise Get regular exercise. This is one of the most important things you  can do for your health. Most adults should:  Exercise for at least 150 minutes each week. The exercise should increase your heart rate and make you sweat (moderate-intensity exercise).  Do strengthening exercises at least twice a week. This is in addition to the moderate-intensity exercise.  Spend less time sitting. Even light physical activity can be beneficial. Watch cholesterol and blood lipids Have your blood tested for lipids and cholesterol at 62 years of age, then have this test every 5 years. You may need to have your cholesterol levels checked more often if:  Your lipid or cholesterol levels are high.  You are older than 62 years of age.  You are at high risk for heart disease. What should I know about cancer screening? Many types of cancers can be detected early and may often be prevented. Depending on your health history and family history, you may need to have cancer screening at various ages. This may include screening for:  Colorectal cancer.  Prostate cancer.  Skin cancer.  Lung cancer. What should I know about heart disease, diabetes, and high blood pressure? Blood pressure and heart disease  High blood pressure causes heart disease and increases the risk of stroke. This is more likely to develop in people who have high blood pressure readings, are of African descent, or are overweight.  Talk with your health care provider about your target blood pressure readings.  Have your blood pressure checked: ? Every 3-5 years if you are 18-39   years of age. ? Every year if you are 40 years old or older.  If you are between the ages of 65 and 75 and are a current or former smoker, ask your health care provider if you should have a one-time screening for abdominal aortic aneurysm (AAA). Diabetes Have regular diabetes screenings. This checks your fasting blood sugar level. Have the screening done:  Once every three years after age 45 if you are at a normal weight and have  a low risk for diabetes.  More often and at a younger age if you are overweight or have a high risk for diabetes. What should I know about preventing infection? Hepatitis B If you have a higher risk for hepatitis B, you should be screened for this virus. Talk with your health care provider to find out if you are at risk for hepatitis B infection. Hepatitis C Blood testing is recommended for:  Everyone born from 1945 through 1965.  Anyone with known risk factors for hepatitis C. Sexually transmitted infections (STIs)  You should be screened each year for STIs, including gonorrhea and chlamydia, if: ? You are sexually active and are younger than 62 years of age. ? You are older than 62 years of age and your health care provider tells you that you are at risk for this type of infection. ? Your sexual activity has changed since you were last screened, and you are at increased risk for chlamydia or gonorrhea. Ask your health care provider if you are at risk.  Ask your health care provider about whether you are at high risk for HIV. Your health care provider may recommend a prescription medicine to help prevent HIV infection. If you choose to take medicine to prevent HIV, you should first get tested for HIV. You should then be tested every 3 months for as long as you are taking the medicine. Follow these instructions at home: Lifestyle  Do not use any products that contain nicotine or tobacco, such as cigarettes, e-cigarettes, and chewing tobacco. If you need help quitting, ask your health care provider.  Do not use street drugs.  Do not share needles.  Ask your health care provider for help if you need support or information about quitting drugs. Alcohol use  Do not drink alcohol if your health care provider tells you not to drink.  If you drink alcohol: ? Limit how much you have to 0-2 drinks a day. ? Be aware of how much alcohol is in your drink. In the [place] one drink equals one 12  oz bottle of beer (355 mL), one 5 oz glass of wine (148 mL), or one 1 oz glass of hard liquor (44 mL). General instructions  Schedule regular health, dental, and eye exams.  Stay current with your vaccines.  Tell your health care provider if: ? You often feel depressed. ? You have ever been abused or do not feel safe at home. Summary  Adopting a healthy lifestyle and getting preventive care are important in promoting health and wellness.  Follow your health care provider's instructions about healthy diet, exercising, and getting tested or screened for diseases.  Follow your health care provider's instructions on monitoring your cholesterol and blood pressure. This information is not intended to replace advice given to you by your health care provider. Make sure you discuss any questions you have with your health care provider. Document Revised: 11/07/2018 Document Reviewed: 11/07/2018 Elsevier Patient Education  2020 Elsevier Inc.  

## 2019-12-04 NOTE — Progress Notes (Signed)
BP Readings from Last 3 Encounters:  04/18/19 125/82  12/06/18 116/73  10/18/18 110/81   Today's Vitals   12/04/19 1328  BP: 116/81  Pulse: 100  Resp: 16  Temp: 98.7 F (37.1 C)  TempSrc: Temporal  SpO2: 95%  Weight: 252 lb (114.3 kg)  Height: 6' (1.829 m)   Body mass index is 34.18 kg/m.       This patient presents for DOT examination for fitness for duty.   Medical History:  1. Head/Brain Injuries, disorders or illnesses no  2. Seizures, epilepsy no  3. Eye disorders or impaired vision (except corrective lenses) no  4. Ear disorders, loss of hearing or balance no  5. Heart disease or heart attack, other cardiovascular condition no  6. Heart surgery (valve replacement/bypass, angioplasty, pacemaker/defribrillator) no  7. High blood pressure yes  8. High cholesterol yes  9. Chronic cough, shortness of breath or other breathing problems no  10. Lung disease (emphysema, asthma or chronic bronchitis) no  11. Kidney disease, dialysis no  12. Digestive problems  no  13. Diabetes or elevated blood sugar no                      If yes to #13, Insulin use n/a  14. Nervious or psychiatric disorders, e.g., severe depression no  15. Fainting or syncope no  16. Dizziness, headaches, numbness, tingling or memory loss no  17. Unexplained weight loss no  18. Stroke, TIA or paralysis no  19. Missing or impaired hand, arm, foot, leg, finger, toe no  20. Spinal injury or disease no  21. Bone, muscles or nerve problems no  22. Blood clots or bleeding bleeding disorders no  23. Cancer no  24. Chronic infection or other chronic diseases no  25. Sleep disorders, pauses in breathing while asleep, daytime sleepiness, loud snoring no  26. Have you ever had a sleep test? no  27.  Have you ever spent a night in the hospital? no  28. Have you ever had a broken bone? no  29. Have you or or do you use tobacco products? no  30. Regular, frequent alcohol use no  31. Illegal substance use  within the past 2 years no  32.  Have you ever failed a drug test or been dependent on an illegal substance? no   Current Medications: Prior to Admission medications   Medication Sig Start Date End Date Taking? Authorizing Provider  allopurinol (ZYLOPRIM) 300 MG tablet Take 1 tablet (300 mg total) by mouth daily. 10/17/19  Yes Sunnie Nielsen, DO  aspirin 81 MG tablet Take 81 mg by mouth daily.   Yes [provider]  lisinopril-hydrochlorothiazide (ZESTORETIC) 20-25 MG tablet Take 1 tablet by mouth daily. 10/17/19  Yes Sunnie Nielsen, DO  pravastatin (PRAVACHOL) 80 MG tablet Take 1 tablet (80 mg total) by mouth daily. 10/17/19  Yes Sunnie Nielsen, DO    Medical Examiner's Comments on Health History:  In good general medical condition.  Well-controlled hypertension on medication.  TESTING:   Hearing Screening   125Hz  250Hz  500Hz  1000Hz  2000Hz  3000Hz  4000Hz  6000Hz  8000Hz   Right ear:           Left ear:           Comments: Whisper test: R ear 10 ft    L ear 10 ft   Visual Acuity Screening   Right eye Left eye Both eyes  Without correction: 20/25-1 20/25-1 20/20  With correction:  Comments: Periph: R eye  85 degrees               L eye   85 degrees Colors: 6/6   Monocular Vision: No.  Hearing Aid used for test: No. Hearing Aid required to to meet standard: No.  BP 116/81   Pulse 100   Temp 98.7 F (37.1 C) (Temporal)   Resp 16   Ht 6' (1.829 m) Comment: PER PT  Wt 252 lb (114.3 kg)   SpO2 95%   BMI 34.18 kg/m  Pulse rate is regular     PHYSICAL EXAMINATION:  General Appearance Not markedly obese. No tremor, signs of alcoholism, problem drinking or drug abuse.   Skin Warm, dry and intact.   Eyes Pupils are equal, round and reactive to light and accommodation, extraocular movements are intact. No exophthalmos, no nystagmus.  Ears Normal external ears. External canal without occlusion. No scarring of the TM. No perforation of the TM.  Mouth and  Throat Clear and moist. No irremedial deformities likely to interfere with breathing or swallowing.  Heart No murmurs, extra sounds, evidence of cardiomegaly. No pacemaker. No implantable defibrillator.  Lungs and Chest (excluding breasts) Normal chest expansion, respiratory rate, breath sounds. No cyanosis.  Abdomen and Viscera No liver enlargement. No splenic enlargement. No masses, bruits, hernias or significant abdominal wall weakness.  Genitourinary  No inguinal or femoral hernia.  Spine and other musculoskeletal No tenderness, no limitation of motion, no deformities. No evidence of previous surgery.  Extremities No loss or impairment of leg, foot, toe, arm, hand, finger. No perceptible limp, deformities, atrophy, weakness, paralysis, clubbing, edema, hypotonia. Patient has sufficient grasp and prehension to maintain steering wheel grip. Patient has sufficient mobility and strength in the lower limbs to operate pedals properly.  Neurologic Normal equilibrium, coordination, speech pattern. No paresthesia, asymmetry of deep tendon reflexes, sensory or positional abnormalities. No abnormality of patellar or Babinski's reflexes.  Gait Not antalgic or ataxic  Vascular Normal pulses. No carotid or arterial bruits. No varicose veins.    Certification Status:  Meets standards, but periodic monitoring required due to: HTN  Driver qualified only for: 1 year   Certification expires 12/03/2020

## 2020-04-23 ENCOUNTER — Ambulatory Visit (INDEPENDENT_AMBULATORY_CARE_PROVIDER_SITE_OTHER): Payer: BC Managed Care – PPO | Admitting: Osteopathic Medicine

## 2020-04-23 ENCOUNTER — Encounter: Payer: Self-pay | Admitting: Osteopathic Medicine

## 2020-04-23 VITALS — BP 124/82 | HR 71 | Temp 96.0°F | Wt 253.0 lb

## 2020-04-23 DIAGNOSIS — I10 Essential (primary) hypertension: Secondary | ICD-10-CM | POA: Diagnosis not present

## 2020-04-23 DIAGNOSIS — Z125 Encounter for screening for malignant neoplasm of prostate: Secondary | ICD-10-CM

## 2020-04-23 DIAGNOSIS — Z0000 Encounter for general adult medical examination without abnormal findings: Secondary | ICD-10-CM | POA: Diagnosis not present

## 2020-04-23 DIAGNOSIS — E7800 Pure hypercholesterolemia, unspecified: Secondary | ICD-10-CM

## 2020-04-23 DIAGNOSIS — M1A0710 Idiopathic chronic gout, right ankle and foot, without tophus (tophi): Secondary | ICD-10-CM | POA: Diagnosis not present

## 2020-04-23 NOTE — Patient Instructions (Addendum)
General Preventive Care  Most recent routine screening labs: 10/2019 was all good, repeat today to monitor and check PSA   Tobacco: don't!   Alcohol: responsible moderation is ok for most adults - if you have concerns about your alcohol intake, please talk to me!   Exercise: as tolerated to reduce risk of cardiovascular disease and diabetes. Strength training will also prevent osteoporosis.   Mental health: if need for mental health care (medicines, counseling, other), or concerns about moods, please let me know!   Sexual health: if need for STD testing, or if concerns with libido/pain problems, please let me know!   Advanced Directive: Living Will and/or Healthcare Power of Attorney recommended for all adults, regardless of age or health.  Vaccines  Flu vaccine: for almost everyone, every fall.   Shingles vaccine: after age 72.   Pneumonia vaccines: after age 79, or sooner if certain medical conditions.  Tetanus booster: every 10 years - due 12/2021  COVID vaccine: STRONGLY RECOMMENDED Cancer screenings   Colon cancer screening: for everyone age 11-75. Colonoscopy due 05/2029  Prostate cancer screening: PSA blood test age 75-71  Lung cancer screening: not needed for non-smokers  Infection screenings  . HIV: done 02/2016, repeat as needed . Gonorrhea/Chlamydia: screening as needed . Hepatitis C: recommended once for everyone age 83-75, done 02/2016, repeat as needed . TB: certain at-risk populations, or depending on work requirements and/or travel history Other . Bone Density Test: recommended age 64 . Abdominal Aortic Aneurysm: screening with ultrasound recommended once age 33-75 who have ever smoked

## 2020-04-23 NOTE — Progress Notes (Signed)
Brandon Sandoval is a 64 y.o. male who presents to  [facility] at [facility]  today, 04/23/20, seeking care for the following: . Annual check-up . BP follow-up      ASSESSMENT & PLAN with other pertinent history/findings:  The primary encounter diagnosis was Annual physical exam. Diagnoses of Essential hypertension, Idiopathic chronic gout of right ankle without tophus, Hypercholesteremia, and Screening PSA (prostate specific antigen) were also pertinent to this visit.     Patient Instructions  General Preventive Care  Most recent routine screening labs: 10/2019 was all good, repeat today to monitor and check PSA   Tobacco: don't!   Alcohol: responsible moderation is ok for most adults - if you have concerns about your alcohol intake, please talk to me!   Exercise: as tolerated to reduce risk of cardiovascular disease and diabetes. Strength training will also prevent osteoporosis.   Mental health: if need for mental health care (medicines, counseling, other), or concerns about moods, please let me know!   Sexual health: if need for STD testing, or if concerns with libido/pain problems, please let me know!   Advanced Directive: Living Will and/or Healthcare Power of Attorney recommended for all adults, regardless of age or health.  Vaccines  Flu vaccine: for almost everyone, every fall.   Shingles vaccine: after age 60.   Pneumonia vaccines: after age 5, or sooner if certain medical conditions.  Tetanus booster: every 10 years - due 12/2021  COVID vaccine: STRONGLY RECOMMENDED Cancer screenings   Colon cancer screening: for everyone age 30-75. Colonoscopy due 05/2029  Prostate cancer screening: PSA blood test age 55-71  Lung cancer screening: not needed for non-smokers  Infection screenings  . HIV: done 02/2016, repeat as needed . Gonorrhea/Chlamydia: screening as needed . Hepatitis C: recommended once for everyone age  10-75, done 02/2016, repeat as needed . TB: certain at-risk populations, or depending on work requirements and/or travel history Other . Bone Density Test: recommended age 24 . Abdominal Aortic Aneurysm: screening with ultrasound recommended once age 86-75 who have ever smoked     Orders Placed This Encounter  Procedures  . CBC  . COMPLETE METABOLIC PANEL WITH GFR  . Lipid panel  . PSA, Total with Reflex to PSA, Free  . Uric acid    No orders of the defined types were placed in this encounter.   BP Readings from Last 3 Encounters:  04/23/20 124/82  12/04/19 116/81  04/18/19 125/82   Constitutional:  . VSS, see nurse notes . General Appearance: alert, well-developed, well-nourished, NAD Eyes: Marland Kitchen Normal lids and conjunctive, non-icteric sclera Neck: . No masses, trachea midli Respiratory: . Normal respiratory effort . No dullness/hyper-resonance to percussion . Breath sounds normal, no wheeze/rhonchi/rales Cardiovascular: . S1/S2 normal, no murmur/rub/gallop auscultated . No lower extremity edema Musculoskeletal:  . Gait normal . No clubbing/cyanosis of digits Neurological: . No cranial nerve deficit on limited exam . Motor and sensation intact and symmetric Psychiatric: . Normal judgment/insight . Normal mood and affect    Follow-up instructions: Return in about 6 months (around 10/24/2020) for BP check and Labs w/ Dr A .                                         BP 124/82 (BP Location: Right Arm, Patient Position: Sitting)   Pulse 71   Temp (!) 96 F (35.6 C)  Wt 253 lb (114.8 kg)   SpO2 96%   BMI 34.31 kg/m   Current Meds  Medication Sig  . allopurinol (ZYLOPRIM) 300 MG tablet Take 1 tablet (300 mg total) by mouth daily.  Marland Kitchen aspirin 81 MG tablet Take 81 mg by mouth daily.  Marland Kitchen lisinopril-hydrochlorothiazide (ZESTORETIC) 20-25 MG tablet Take 1 tablet by mouth daily.  . pravastatin (PRAVACHOL) 80 MG tablet Take 1  tablet (80 mg total) by mouth daily.    No results found for this or any previous visit (from the past 72 hour(s)).  No results found.  Depression screen [facility] 2/9 12/04/2019 10/17/2019 04/18/2019  Decreased Interest 0 0 0  Down, Depressed, Hopeless 0 0 0  PHQ - 2 Score 0 0 0    GAD 7 : Generalized Anxiety Score 10/17/2019  Nervous, Anxious, on Edge 0  Control/stop worrying 0  Worry too much - different things 0  Trouble relaxing 0  Restless 0  Easily annoyed or irritable 0  Afraid - awful might happen 0  Total GAD 7 Score 0      All questions at time of visit were answered - patient instructed to contact office with any additional concerns or updates.  ER/RTC precautions were reviewed with the patient.  Please note: voice recognition software was used to produce this document, and typos may escape review. Please contact Dr. Sheppard Coil for any needed clarifications.

## 2020-10-15 ENCOUNTER — Other Ambulatory Visit: Payer: Self-pay

## 2020-10-15 DIAGNOSIS — M1A0710 Idiopathic chronic gout, right ankle and foot, without tophus (tophi): Secondary | ICD-10-CM

## 2020-10-15 DIAGNOSIS — I10 Essential (primary) hypertension: Secondary | ICD-10-CM

## 2020-10-15 DIAGNOSIS — E7800 Pure hypercholesterolemia, unspecified: Secondary | ICD-10-CM

## 2020-10-15 MED ORDER — PRAVASTATIN SODIUM 80 MG PO TABS: 80.0000 mg | ORAL_TABLET | Freq: Every day | ORAL | 0 refills | Status: DC

## 2020-10-15 MED ORDER — LISINOPRIL-HYDROCHLOROTHIAZIDE 20-25 MG PO TABS: 1.0000 | ORAL_TABLET | Freq: Every day | ORAL | 0 refills | Status: DC

## 2020-10-15 MED ORDER — ALLOPURINOL 300 MG PO TABS: 300.0000 mg | ORAL_TABLET | Freq: Every day | ORAL | 0 refills | Status: DC

## 2020-10-29 ENCOUNTER — Encounter: Payer: Self-pay | Admitting: Osteopathic Medicine

## 2020-10-29 ENCOUNTER — Other Ambulatory Visit: Payer: Self-pay

## 2020-10-29 ENCOUNTER — Telehealth: Payer: Self-pay | Admitting: Osteopathic Medicine

## 2020-10-29 ENCOUNTER — Ambulatory Visit (INDEPENDENT_AMBULATORY_CARE_PROVIDER_SITE_OTHER): Payer: BC Managed Care – PPO | Admitting: Osteopathic Medicine

## 2020-10-29 VITALS — BP 119/82 | HR 80 | Temp 98.1°F | Wt 253.0 lb

## 2020-10-29 DIAGNOSIS — E7800 Pure hypercholesterolemia, unspecified: Secondary | ICD-10-CM

## 2020-10-29 DIAGNOSIS — I10 Essential (primary) hypertension: Secondary | ICD-10-CM | POA: Diagnosis not present

## 2020-10-29 DIAGNOSIS — M1A0710 Idiopathic chronic gout, right ankle and foot, without tophus (tophi): Secondary | ICD-10-CM | POA: Diagnosis not present

## 2020-10-29 DIAGNOSIS — Z125 Encounter for screening for malignant neoplasm of prostate: Secondary | ICD-10-CM

## 2020-10-29 MED ORDER — PRAVASTATIN SODIUM 80 MG PO TABS: 80.0000 mg | ORAL_TABLET | Freq: Every day | ORAL | 3 refills | Status: DC

## 2020-10-29 MED ORDER — LISINOPRIL-HYDROCHLOROTHIAZIDE 20-25 MG PO TABS: 1.0000 | ORAL_TABLET | Freq: Every day | ORAL | 3 refills | Status: DC

## 2020-10-29 MED ORDER — ALLOPURINOL 300 MG PO TABS: 300.0000 mg | ORAL_TABLET | Freq: Every day | ORAL | 3 refills | Status: DC

## 2020-10-29 NOTE — Telephone Encounter (Signed)
No results in system but patient states he definitely got labs drawn downstairs in 03/2020 - can we contact [facility] to see if these are in their records but didn't come through to Epic? If unable to find results, patient ok to repeat labs, orders are in. Thanks.

## 2020-10-29 NOTE — Progress Notes (Signed)
HPI: Brandon Sandoval is a 64 y.o. male who  has a past medical history of Burn, Gout, Hyperlipidemia, Hypertension, Hyperuricemia (04/19/2019), and Renal insufficiency (04/19/2019).  he presents to [facility] today, 10/29/20,  for chief complaint of:  BP follow up   Current Rx: lisniopril-hydrochlorothiazide 20-25 mg daily  Home bp:  Not checking home bp; has bp cuff at home that wife uses  Pt reports doing well with no complaints.  BP Readings from Last 3 Encounters:  10/29/20 119/82  04/23/20 124/82  12/04/19 116/81      Past medical, surgical, social and family history reviewed:  Patient Active Problem List   Diagnosis Date Noted  . Hyperuricemia 04/19/2019  . Renal insufficiency 04/19/2019  . Elevated serum creatinine 04/19/2019  . Class 1 obesity due to excess calories with serious comorbidity in adult 04/17/2018  . Gout   . HTN (hypertension) 03/02/2012  . Mixed hyperlipidemia 03/02/2012    Past Surgical History:  Procedure Laterality Date  . NO PAST SURGERIES      Social History   Tobacco Use  . Smoking status: Never Smoker  . Smokeless tobacco: Never Used  Substance Use Topics  . Alcohol use: No    Alcohol/week: 0.0 standard drinks    Family History  Problem Relation Age of Onset  . Diabetes Mother   . Heart disease Mother   . Heart disease Father        pacemaker  . Hypertension Father   . Kidney failure Father   . Heart disease Brother   . Lung disease Cousin   . Prostate cancer Neg Hx   . Colon cancer Neg Hx      Current medication list and allergy/intolerance information reviewed:    Current Outpatient Medications  Medication Sig Dispense Refill  . allopurinol (ZYLOPRIM) 300 MG tablet Take 1 tablet (300 mg total) by mouth daily. 90 tablet 3  . aspirin 81 MG tablet Take 81 mg by mouth daily.    Marland Kitchen lisinopril-hydrochlorothiazide (ZESTORETIC) 20-25 MG tablet Take 1 tablet by mouth daily. 90 tablet 3  .  pravastatin (PRAVACHOL) 80 MG tablet Take 1 tablet (80 mg total) by mouth daily. 90 tablet 3   No current facility-administered medications for this visit.    No Known Allergies    Review of Systems:.   HEENT: No  headache  Cardiac: No  chest pain  Respiratory:  No  shortness of breath.   Neurologic:  No  dizziness  Exam:  BP 119/82 (BP Location: Left Arm, Patient Position: Sitting, Cuff Size: Large)   Pulse 80   Temp 98.1 F (36.7 C) (Oral)   Wt 253 lb 0.6 oz (114.8 kg)   BMI 34.32 kg/m   Constitutional: VS see above. General Appearance: alert, well-developed, well-nourished, NAD  Respiratory: Normal respiratory effort. no wheeze, no rhonchi, no rales  Cardiovascular: S1/S2 normal, no murmur, no rub/gallop auscultated. RRR.   Skin: warm, dry, intact.   Psychiatric: Normal judgment/insight. Normal mood and affect.    No results found for this or any previous visit (from the past 72 hour(s)).  No results found.      ASSESSMENT/PLAN: The primary encounter diagnosis was Essential hypertension. Diagnoses of Hypercholesteremia, Idiopathic chronic gout of right ankle without tophus, and Screening PSA (prostate specific antigen) were also pertinent to this visit.   Health maintenance  Yearly screening labs ordered in 03/2020 and not yet completed in our system and cannot locate  Pt [facility]   Will [facility] and try to locate results  Will reorder all labs today and get them drawn if cannot locate results from prior labs - urica acid, PSA, lipid panel, CMP, CBC      Hypertension - controlled  Continue current medication regimen   Counseled to begin checking home bp - already has cuff at home that wife uses  Follow up in 6 months    Orders Placed This Encounter  Procedures  . CBC  . COMPLETE METABOLIC PANEL WITH GFR  . Lipid panel  . PSA, Total with Reflex to PSA, Free  . Uric acid     Meds ordered this encounter  Medications  . pravastatin (PRAVACHOL) 80 MG tablet    Sig: Take 1 tablet (80 mg total) by mouth daily.    Dispense:  90 tablet    Refill:  3  . lisinopril-hydrochlorothiazide (ZESTORETIC) 20-25 MG tablet    Sig: Take 1 tablet by mouth daily.    Dispense:  90 tablet    Refill:  3  . allopurinol (ZYLOPRIM) 300 MG tablet    Sig: Take 1 tablet (300 mg total) by mouth daily.    Dispense:  90 tablet    Refill:  3    There are no Patient Instructions on file for this visit.      Visit summary with medication list and pertinent instructions was printed for patient to review. All questions at time of visit were answered - patient instructed to contact office with any additional concerns or updates. ER/RTC precautions were reviewed with the patient.    Please note: voice recognition software was used to produce this document, and typos may escape review. Please contact Dr. Lyn Hollingshead for any needed clarifications.     Follow-up plan: Return in about 6 months (around 04/29/2021) for ANNUAL CHECK-UP, MONITOR BLOOD PRESSURE AND LABS - SEE [place] SOONER IF NEEDED.

## 2020-10-29 NOTE — Telephone Encounter (Signed)
I called patient he states he will call us back when he looks at the date of his last bill.

## 2020-11-03 DIAGNOSIS — I10 Essential (primary) hypertension: Secondary | ICD-10-CM | POA: Diagnosis not present

## 2020-11-03 DIAGNOSIS — Z125 Encounter for screening for malignant neoplasm of prostate: Secondary | ICD-10-CM | POA: Diagnosis not present

## 2020-11-03 DIAGNOSIS — E7800 Pure hypercholesterolemia, unspecified: Secondary | ICD-10-CM | POA: Diagnosis not present

## 2020-11-03 DIAGNOSIS — M1A0710 Idiopathic chronic gout, right ankle and foot, without tophus (tophi): Secondary | ICD-10-CM | POA: Diagnosis not present

## 2020-11-04 LAB — CBC
HCT: 40.8 % (ref 38.5–50.0)
Hemoglobin: 13.9 g/dL (ref 13.2–17.1)
MCH: 31.7 pg (ref 27.0–33.0)
MCHC: 34.1 g/dL (ref 32.0–36.0)
MCV: 92.9 fL (ref 80.0–100.0)
MPV: 10.4 fL (ref 7.5–12.5)
Platelets: 280 10*3/uL (ref 140–400)
RBC: 4.39 10*6/uL (ref 4.20–5.80)
RDW: 11.9 % (ref 11.0–15.0)
WBC: 7.9 10*3/uL (ref 3.8–10.8)

## 2020-11-04 LAB — LIPID PANEL
Cholesterol: 183 mg/dL (ref <200)
HDL: 48 mg/dL (ref >=40)
LDL Cholesterol (Calc): 109 mg/dL (calc) — ABNORMAL HIGH
Non-HDL Cholesterol (Calc): 135 mg/dL (calc) — ABNORMAL HIGH (ref <130)
Total CHOL/HDL Ratio: 3.8 (calc) (ref <5.0)
Triglycerides: 148 mg/dL (ref <150)

## 2020-11-04 LAB — COMPLETE METABOLIC PANEL WITH GFR
AG Ratio: 1.4 (calc) (ref 1.0–2.5)
ALT: 42 U/L (ref 9–46)
AST: 27 U/L (ref 10–35)
Albumin: 4.5 g/dL (ref 3.6–5.1)
Alkaline phosphatase (APISO): 74 U/L (ref 35–144)
BUN: 13 mg/dL (ref 7–25)
CO2: 25 mmol/L (ref 20–32)
Calcium: 9.6 mg/dL (ref 8.6–10.3)
Chloride: 104 mmol/L (ref 98–110)
Creat: 1.17 mg/dL (ref 0.70–1.25)
GFR, Est African American: 77 mL/min/{1.73_m2} (ref >=60)
GFR, Est Non African American: 66 mL/min/{1.73_m2} (ref >=60)
Globulin: 3.2 g/dL (calc) (ref 1.9–3.7)
Glucose, Bld: 86 mg/dL (ref 65–99)
Potassium: 3.9 mmol/L (ref 3.5–5.3)
Sodium: 141 mmol/L (ref 135–146)
Total Bilirubin: 0.6 mg/dL (ref 0.2–1.2)
Total Protein: 7.7 g/dL (ref 6.1–8.1)

## 2020-11-04 LAB — URIC ACID: Uric Acid, Serum: 5.7 mg/dL (ref 4.0–8.0)

## 2020-11-04 LAB — PSA, TOTAL WITH REFLEX TO PSA, FREE: PSA, Total: 0.5 ng/mL (ref <=4.0)

## 2020-12-28 DIAGNOSIS — R531 Weakness: Secondary | ICD-10-CM | POA: Diagnosis not present

## 2020-12-28 DIAGNOSIS — U071 COVID-19: Secondary | ICD-10-CM | POA: Diagnosis not present

## 2020-12-28 DIAGNOSIS — M791 Myalgia, unspecified site: Secondary | ICD-10-CM | POA: Diagnosis not present

## 2020-12-28 DIAGNOSIS — R519 Headache, unspecified: Secondary | ICD-10-CM | POA: Diagnosis not present

## 2020-12-28 DIAGNOSIS — R5383 Other fatigue: Secondary | ICD-10-CM | POA: Diagnosis not present

## 2020-12-28 DIAGNOSIS — R918 Other nonspecific abnormal finding of lung field: Secondary | ICD-10-CM | POA: Diagnosis not present

## 2020-12-28 DIAGNOSIS — I10 Essential (primary) hypertension: Secondary | ICD-10-CM | POA: Diagnosis not present

## 2020-12-28 DIAGNOSIS — Z79899 Other long term (current) drug therapy: Secondary | ICD-10-CM | POA: Diagnosis not present

## 2021-04-29 ENCOUNTER — Ambulatory Visit: Payer: BC Managed Care – PPO | Admitting: Osteopathic Medicine

## 2021-05-06 ENCOUNTER — Ambulatory Visit: Payer: BC Managed Care – PPO | Admitting: Osteopathic Medicine

## 2021-05-06 ENCOUNTER — Other Ambulatory Visit: Payer: Self-pay

## 2021-05-06 VITALS — BP 120/77 | HR 90 | Temp 99.0°F | Wt 254.1 lb

## 2021-05-06 DIAGNOSIS — M1A0710 Idiopathic chronic gout, right ankle and foot, without tophus (tophi): Secondary | ICD-10-CM

## 2021-05-06 DIAGNOSIS — E7800 Pure hypercholesterolemia, unspecified: Secondary | ICD-10-CM | POA: Diagnosis not present

## 2021-05-06 DIAGNOSIS — I10 Essential (primary) hypertension: Secondary | ICD-10-CM

## 2021-05-06 DIAGNOSIS — Z0000 Encounter for general adult medical examination without abnormal findings: Secondary | ICD-10-CM | POA: Diagnosis not present

## 2021-05-06 MED ORDER — ALLOPURINOL 300 MG PO TABS: 300.0000 mg | ORAL_TABLET | Freq: Every day | ORAL | 3 refills | Status: DC

## 2021-05-06 MED ORDER — PRAVASTATIN SODIUM 80 MG PO TABS
80.0000 mg | ORAL_TABLET | Freq: Every day | ORAL | 3 refills | Status: DC
Start: 2021-05-06 — End: 2022-08-03

## 2021-05-06 MED ORDER — LISINOPRIL-HYDROCHLOROTHIAZIDE 20-25 MG PO TABS: 1.0000 | ORAL_TABLET | Freq: Every day | ORAL | 3 refills | Status: DC

## 2021-05-06 NOTE — Patient Instructions (Signed)
General Preventive Care Most recent routine screening labs: 10/2020.  Blood pressure goal 130/80 or less.  Tobacco: don't!  Alcohol: responsible moderation is ok for most adults - if you have concerns about your alcohol intake, please talk to me!  Exercise: as tolerated to reduce risk of cardiovascular disease and diabetes. Strength training will also prevent osteoporosis.  Mental health: if need for mental health care (medicines, counseling, other), or concerns about moods, please let me know!  Sexual / Reproductive health: if need for STD testing, or if concerns with libido/pain problems, please let me know!  Advanced Directive: Living Will and/or Healthcare Power of Attorney recommended for all adults, regardless of age or health.  Vaccines Flu vaccine: for almost everyone, every fall.  Shingles vaccine: after age 27.  Pneumonia vaccines: after age 61. Tetanus booster: every 10 years, due 12/2021 COVID vaccine: THANKS for getting your vaccine! :)  Cancer screenings  Colon cancer screening: for everyone age 103-75. Due 05/2029 Prostate cancer screening: PSA blood test age 25, was normal 10/2020 Lung cancer screening: not needed for non-smokers  Infection screenings  HIV: recommended screening at least once age 48-65. Gonorrhea/Chlamydia: screening as needed Hepatitis C: recommended once for everyone age 33-75 TB: certain at-risk populations

## 2021-05-06 NOTE — Progress Notes (Signed)
Brandon Sandoval is a 65 y.o. male who presents to  [facility] at [facility]  today, 05/06/21, seeking care for the following:  Annual physical  BP check   BP Readings from Last 3 Encounters:  05/06/21 120/77  10/29/20 119/82  04/23/20 124/82      ASSESSMENT & PLAN with other pertinent findings:  The primary encounter diagnosis was Annual physical exam. Diagnoses of Essential hypertension, Idiopathic chronic gout of right ankle without tophus, and Hypercholesteremia were also pertinent to this visit.    Patient Instructions  General Preventive Care Most recent routine screening labs: 10/2020.  Blood pressure goal 130/80 or less.  Tobacco: don't!  Alcohol: responsible moderation is ok for most adults - if you have concerns about your alcohol intake, please talk to me!  Exercise: as tolerated to reduce risk of cardiovascular disease and diabetes. Strength training will also prevent osteoporosis.  Mental health: if need for mental health care (medicines, counseling, other), or concerns about moods, please let me know!  Sexual / Reproductive health: if need for STD testing, or if concerns with libido/pain problems, please let me know!  Advanced Directive: Living Will and/or Healthcare Power of Attorney recommended for all adults, regardless of age or health.  Vaccines Flu vaccine: for almost everyone, every fall.  Shingles vaccine: after age 40.  Pneumonia vaccines: after age 33. Tetanus booster: every 10 years, due 12/2021 COVID vaccine: THANKS for getting your vaccine! :)  Cancer screenings  Colon cancer screening: for everyone age 63-75. Due 05/2029 Prostate cancer screening: PSA blood test age 62, was normal 10/2020 Lung cancer screening: not needed for non-smokers  Infection screenings  HIV: recommended screening at least once age 1-65. Gonorrhea/Chlamydia: screening as needed Hepatitis C: recommended once for everyone age  49-75 TB: certain at-risk populations  No orders of the defined types were placed in this encounter.   Meds ordered this encounter  Medications   lisinopril-hydrochlorothiazide (ZESTORETIC) 20-25 MG tablet    Sig: Take 1 tablet by mouth daily.    Dispense:  90 tablet    Refill:  3   pravastatin (PRAVACHOL) 80 MG tablet    Sig: Take 1 tablet (80 mg total) by mouth daily.    Dispense:  90 tablet    Refill:  3   allopurinol (ZYLOPRIM) 300 MG tablet    Sig: Take 1 tablet (300 mg total) by mouth daily.    Dispense:  90 tablet    Refill:  3     See below for relevant physical exam findings  See below for recent lab and imaging results reviewed  Medications, allergies, PMH, PSH, SocH, FamH reviewed below    Follow-up instructions: Return in about 1 year (around 05/06/2022) for [place] (call week prior to visit for lab orders). Labs: CBC, CMP, Lipids, Uric Acid                                         Exam:  BP 120/77 (BP Location: Left Arm, Patient Position: Sitting, Cuff Size: Large)   Pulse 90   Temp 99 F (37.2 C) (Oral)   Wt 254 lb 1.9 oz (115.3 kg)   BMI 34.46 kg/m  Constitutional: VS see above. General Appearance: alert, well-developed, well-nourished, NAD Neck: No masses, trachea midline.  Respiratory: Normal respiratory effort. no wheeze, no rhonchi, no rales Cardiovascular: S1/S2 normal, no murmur,  no rub/gallop auscultated. RRR.  Musculoskeletal: Gait normal. Symmetric and independent movement of all extremities Abdominal: non-tender, non-distended, no appreciable organomegaly, neg Murphy's, BS [id number] Neurological: Normal balance/coordination. No tremor. Skin: warm, dry, intact.  Psychiatric: Normal judgment/insight. Normal mood and affect. Oriented x3.   Current Meds  Medication Sig   aspirin 81 MG tablet Take 81 mg by mouth daily.   [DISCONTINUED] allopurinol (ZYLOPRIM) 300 MG tablet Take 1 tablet (300 mg total) by mouth daily.    [DISCONTINUED] lisinopril-hydrochlorothiazide (ZESTORETIC) 20-25 MG tablet Take 1 tablet by mouth daily.   [DISCONTINUED] pravastatin (PRAVACHOL) 80 MG tablet Take 1 tablet (80 mg total) by mouth daily.    No Known Allergies  Patient Active Problem List   Diagnosis Date Noted   Hyperuricemia 04/19/2019   Renal insufficiency 04/19/2019   Elevated serum creatinine 04/19/2019   Class 1 obesity due to excess calories with serious comorbidity in adult 04/17/2018   Gout    HTN (hypertension) 03/02/2012   Mixed hyperlipidemia 03/02/2012    Family History  Problem Relation Age of Onset   Diabetes Mother    Heart disease Mother    Heart disease Father        pacemaker   Hypertension Father    Kidney failure Father    Heart disease Brother    Lung disease Cousin    Prostate cancer Neg Hx    Colon cancer Neg Hx     Social History   Tobacco Use  Smoking Status Never  Smokeless Tobacco Never    Past Surgical History:  Procedure Laterality Date   NO PAST SURGERIES      Immunization History  Administered Date(s) Administered   PFIZER(Purple Top)SARS-COV-2 Vaccination 02/14/2020, 03/06/2020, 02/11/2021   Tdap 01/16/2012    No results found for this or any previous visit (from the past 2160 hour(s)).  No results found.     All questions at time of visit were answered - patient instructed to contact office with any additional concerns or updates. ER/RTC precautions were reviewed with the patient as applicable.   Please note: manual typing as well as voice recognition software may have been used to produce this document - typos may escape review. Please contact Dr. Lyn Hollingshead for any needed clarifications.

## 2022-05-12 ENCOUNTER — Ambulatory Visit (INDEPENDENT_AMBULATORY_CARE_PROVIDER_SITE_OTHER): Payer: BC Managed Care – PPO | Admitting: Family Medicine

## 2022-05-12 ENCOUNTER — Encounter: Payer: Self-pay | Admitting: Family Medicine

## 2022-05-12 ENCOUNTER — Encounter: Payer: BC Managed Care – PPO | Admitting: Osteopathic Medicine

## 2022-05-12 VITALS — BP 119/77 | HR 69 | Ht 72.0 in | Wt 258.0 lb

## 2022-05-12 DIAGNOSIS — Z0000 Encounter for general adult medical examination without abnormal findings: Secondary | ICD-10-CM | POA: Diagnosis not present

## 2022-05-12 DIAGNOSIS — Z23 Encounter for immunization: Secondary | ICD-10-CM

## 2022-05-12 DIAGNOSIS — E782 Mixed hyperlipidemia: Secondary | ICD-10-CM

## 2022-05-12 DIAGNOSIS — Z125 Encounter for screening for malignant neoplasm of prostate: Secondary | ICD-10-CM | POA: Diagnosis not present

## 2022-05-12 DIAGNOSIS — E790 Hyperuricemia without signs of inflammatory arthritis and tophaceous disease: Secondary | ICD-10-CM | POA: Diagnosis not present

## 2022-05-12 DIAGNOSIS — I10 Essential (primary) hypertension: Secondary | ICD-10-CM

## 2022-05-12 NOTE — Assessment & Plan Note (Signed)
Well adult Orders Placed This Encounter  Procedures  . COMPLETE METABOLIC PANEL WITH GFR  . CBC with Differential  . Lipid Panel w/reflex Direct LDL  . TSH  . PSA  . Uric acid  Screening: Per lab orders Immunizations: Tdap given today.  He declines shingrix at this time.  Anticipatory guidance/Risk factor reduction:  Continue current medications.  Additional recommendations per AVS.

## 2022-05-12 NOTE — Patient Instructions (Signed)

## 2022-05-12 NOTE — Progress Notes (Signed)
[name] - 66 y.o. male MRN [id number]  Date of birth: Apr 10, 1956  Subjective Chief Complaint  Patient presents with   Transitions Of Care   Annual Exam    HPI Brandon Sandoval is a 66 y.o. male here today for annual exam.  This is his first visit with me.  He has a history of HTN that is treated lisinopril/hctz.  He is doing well with this.  Additionally, he is treated with pravastatin for HLD.  Denies side effects from medications.  He does try to stay fairly active.  He feels like diet is "ok".   He is a non-smoker.  Denies EtOH use.   He is due for Tdap and will take this today.   He declines shingles vaccine.   Review of Systems  Constitutional:  Negative for chills, fever, malaise/fatigue and weight loss.  HENT:  Negative for congestion, ear pain and sore throat.   Eyes:  Negative for blurred vision, double vision and pain.  Respiratory:  Negative for cough and shortness of breath.   Cardiovascular:  Negative for chest pain and palpitations.  Gastrointestinal:  Negative for abdominal pain, blood in stool, constipation, heartburn and nausea.  Genitourinary:  Negative for dysuria and urgency.  Musculoskeletal:  Negative for joint pain and myalgias.  Neurological:  Negative for dizziness and headaches.  Endo/Heme/Allergies:  Does not bruise/bleed easily.  Psychiatric/Behavioral:  Negative for depression. The patient is not nervous/anxious and does not have insomnia.     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      Lives: with wife, 1 daughter      Employment: truck Hospital doctor; IT trainer route only; previous school bus driver also      Education:Associate's Degree.      Tobacco: none      Alcohol: none      Exercise: none in 2018; maintains own lawn.      Seatbelt: 100%; no texting.    Social Determinants of Health   Financial Resource Strain: Not on file  Food Insecurity: Not on file  Transportation Needs: Not on file  Physical Activity: Not on file  Stress: Not on file  Social Connections: Not on file    Family History  Problem Relation Age of Onset   Diabetes Mother    Heart disease Mother    Heart disease Father        pacemaker   Hypertension Father    Kidney failure Father  Heart disease Brother    Lung disease Cousin    Prostate cancer Neg Hx    Colon cancer Neg Hx     Health Maintenance  Topic Date Due   TETANUS/TDAP  01/15/2022   COVID-19 Vaccine (4 - Pfizer series) 05/28/2022 (Originally 04/08/2021)   Zoster Vaccines- Shingrix (1 of 2) 08/13/2023 (Originally 06/04/2008)   INFLUENZA VACCINE  06/28/2022   COLONOSCOPY (Pts 45-47yrs Insurance coverage will need to be confirmed)  06/04/2029   Hepatitis C Screening  Completed   HIV Screening  Completed   HPV VACCINES  Aged Out     ----------------------------------------------------------------------------------------------------------------------------------------------------------------------------------------------------------------- Physical Exam BP 119/77 (BP Location: Left Arm, Patient Position: Sitting, Cuff Size: Large)   Pulse 69    Ht 6' (1.829 m)   Wt 258 lb (117 kg)   SpO2 96%   BMI 34.99 kg/m   Physical Exam Constitutional:      General: He is not in acute distress. HENT:     Head: Normocephalic and atraumatic.     Right Ear: Tympanic membrane and external ear normal.     Left Ear: Tympanic membrane and external ear normal.  Eyes:     General: No scleral icterus. Neck:     Thyroid: No thyromegaly.  Cardiovascular:     Rate and Rhythm: Normal rate and regular rhythm.     Heart sounds: Normal heart sounds.  Pulmonary:     Effort: Pulmonary effort is normal.     Breath sounds: Normal breath sounds.  Abdominal:     General: Bowel sounds are normal. There is no distension.     Palpations: Abdomen is soft.     Tenderness: There is no abdominal tenderness. There is no guarding.  Musculoskeletal:     Cervical back: Normal range of motion.  Lymphadenopathy:     Cervical: No cervical adenopathy.  Skin:    General: Skin is warm and dry.     Findings: No rash.  Neurological:     Mental Status: He is alert and oriented to person, place, and time.     Cranial Nerves: No cranial nerve deficit.     Motor: No abnormal muscle tone.  Psychiatric:        Mood and Affect: Mood normal.        Behavior: Behavior normal.     ------------------------------------------------------------------------------------------------------------------------------------------------------------------------------------------------------------------- Assessment and Plan  Well adult exam Well adult Orders Placed This Encounter  Procedures   COMPLETE METABOLIC PANEL WITH GFR   CBC with Differential   Lipid Panel w/reflex Direct LDL   TSH   PSA   Uric acid  Screening: Per lab orders Immunizations: Tdap given today.  He declines shingrix at this time.  Anticipatory guidance/Risk factor reduction:  Continue current medications.  Additional recommendations per AVS.    No orders of the defined types were placed in this  encounter.   No follow-ups on file.    This visit occurred during the SARS-CoV-2 public health emergency.  Safety protocols were in place, including screening questions prior to the visit, additional usage of staff PPE, and extensive cleaning of exam room while observing appropriate contact time as indicated for disinfecting solutions.

## 2022-05-13 LAB — COMPLETE METABOLIC PANEL WITH GFR
AG Ratio: 1.3 (calc) (ref 1.0–2.5)
ALT: 66 U/L — ABNORMAL HIGH (ref 9–46)
AST: 51 U/L — ABNORMAL HIGH (ref 10–35)
Albumin: 4.4 g/dL (ref 3.6–5.1)
Alkaline phosphatase (APISO): 74 U/L (ref 35–144)
BUN: 13 mg/dL (ref 7–25)
CO2: 27 mmol/L (ref 20–32)
Calcium: 9.8 mg/dL (ref 8.6–10.3)
Chloride: 102 mmol/L (ref 98–110)
Creat: 1.16 mg/dL (ref 0.70–1.35)
Globulin: 3.3 g/dL (calc) (ref 1.9–3.7)
Glucose, Bld: 87 mg/dL (ref 65–99)
Potassium: 4.1 mmol/L (ref 3.5–5.3)
Sodium: 139 mmol/L (ref 135–146)
Total Bilirubin: 0.7 mg/dL (ref 0.2–1.2)
Total Protein: 7.7 g/dL (ref 6.1–8.1)
eGFR: 71 mL/min/{1.73_m2} (ref >=60)

## 2022-05-13 LAB — CBC WITH DIFFERENTIAL/PLATELET
Absolute Monocytes: 544 cells/uL (ref 200–950)
Basophils Absolute: 48 cells/uL (ref 0–200)
Basophils Relative: 0.6 %
Eosinophils Absolute: 424 cells/uL (ref 15–500)
Eosinophils Relative: 5.3 %
HCT: 41.5 % (ref 38.5–50.0)
Hemoglobin: 14.2 g/dL (ref 13.2–17.1)
Lymphs Abs: 3344 cells/uL (ref 850–3900)
MCH: 32.9 pg (ref 27.0–33.0)
MCHC: 34.2 g/dL (ref 32.0–36.0)
MCV: 96.1 fL (ref 80.0–100.0)
MPV: 10.4 fL (ref 7.5–12.5)
Monocytes Relative: 6.8 %
Neutro Abs: 3640 cells/uL (ref 1500–7800)
Neutrophils Relative %: 45.5 %
Platelets: 252 10*3/uL (ref 140–400)
RBC: 4.32 10*6/uL (ref 4.20–5.80)
RDW: 11.8 % (ref 11.0–15.0)
Total Lymphocyte: 41.8 %
WBC: 8 10*3/uL (ref 3.8–10.8)

## 2022-05-13 LAB — PSA: PSA: 0.33 ng/mL (ref <=4.00)

## 2022-05-13 LAB — LIPID PANEL W/REFLEX DIRECT LDL
Cholesterol: 164 mg/dL (ref <200)
HDL: 50 mg/dL (ref >=40)
LDL Cholesterol (Calc): 93 mg/dL (calc)
Non-HDL Cholesterol (Calc): 114 mg/dL (calc) (ref <130)
Total CHOL/HDL Ratio: 3.3 (calc) (ref <5.0)
Triglycerides: 114 mg/dL (ref <150)

## 2022-05-13 LAB — URIC ACID: Uric Acid, Serum: 5.4 mg/dL (ref 4.0–8.0)

## 2022-05-13 LAB — TSH: TSH: 0.58 mIU/L (ref 0.40–4.50)

## 2022-07-18 ENCOUNTER — Telehealth: Payer: Self-pay | Admitting: Family Medicine

## 2022-07-18 ENCOUNTER — Other Ambulatory Visit: Payer: Self-pay

## 2022-07-18 MED ORDER — LISINOPRIL-HYDROCHLOROTHIAZIDE 20-25 MG PO TABS: 1.0000 | ORAL_TABLET | Freq: Every day | ORAL | 1 refills | Status: DC

## 2022-07-18 NOTE — Telephone Encounter (Signed)
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  Could you please send in the refill.  Thank you Arline Asp

## 2022-08-03 ENCOUNTER — Other Ambulatory Visit: Payer: Self-pay

## 2022-08-03 MED ORDER — ALLOPURINOL 300 MG PO TABS: 300.0000 mg | ORAL_TABLET | Freq: Every day | ORAL | 3 refills | Status: DC

## 2022-08-03 MED ORDER — PRAVASTATIN SODIUM 80 MG PO TABS: 80.0000 mg | ORAL_TABLET | Freq: Every day | ORAL | 3 refills | Status: DC

## 2022-11-09 ENCOUNTER — Ambulatory Visit: Payer: BC Managed Care – PPO | Admitting: Family Medicine

## 2022-11-09 ENCOUNTER — Encounter: Payer: Self-pay | Admitting: Family Medicine

## 2022-11-09 VITALS — BP 126/75 | HR 76 | Ht 72.0 in | Wt 264.0 lb

## 2022-11-09 DIAGNOSIS — E790 Hyperuricemia without signs of inflammatory arthritis and tophaceous disease: Secondary | ICD-10-CM

## 2022-11-09 DIAGNOSIS — E782 Mixed hyperlipidemia: Secondary | ICD-10-CM

## 2022-11-09 DIAGNOSIS — I10 Essential (primary) hypertension: Secondary | ICD-10-CM | POA: Diagnosis not present

## 2022-11-09 MED ORDER — LISINOPRIL-HYDROCHLOROTHIAZIDE 20-25 MG PO TABS
1.0000 | ORAL_TABLET | Freq: Every day | ORAL | 3 refills | Status: DC
Start: 2022-11-09 — End: 2023-11-10

## 2022-11-09 NOTE — Assessment & Plan Note (Signed)
Lab Results  Component Value Date   LDLCALC 93 05/12/2022  Continue pravastatin at current strength.

## 2022-11-09 NOTE — Assessment & Plan Note (Signed)
Doing well with allopurinol.  No recent gout flares.  Continue at current strength.
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
## 2022-11-09 NOTE — Assessment & Plan Note (Signed)
BP is well controlled with current medication.  Continue lisinopril/hctz at current strength.

## 2023-05-11 ENCOUNTER — Encounter: Payer: Self-pay | Admitting: Family Medicine

## 2023-05-11 ENCOUNTER — Ambulatory Visit (INDEPENDENT_AMBULATORY_CARE_PROVIDER_SITE_OTHER): Payer: BC Managed Care – PPO | Admitting: Family Medicine

## 2023-05-11 VITALS — BP 130/81 | HR 68 | Ht 72.0 in | Wt 257.0 lb

## 2023-05-11 DIAGNOSIS — Z0000 Encounter for general adult medical examination without abnormal findings: Secondary | ICD-10-CM | POA: Diagnosis not present

## 2023-05-11 DIAGNOSIS — E782 Mixed hyperlipidemia: Secondary | ICD-10-CM

## 2023-05-11 DIAGNOSIS — Z125 Encounter for screening for malignant neoplasm of prostate: Secondary | ICD-10-CM

## 2023-05-11 DIAGNOSIS — I10 Essential (primary) hypertension: Secondary | ICD-10-CM
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
## 2023-05-11 NOTE — Patient Instructions (Signed)
Preventive Care 67 Years Old, Male Preventive care refers to lifestyle choices and visits with your health care provider that can promote health and wellness. Preventive care visits are also called wellness exams. What can I expect for my preventive care visit? Counseling During your preventive care visit, your health care provider may ask about your: Medical history, including: Past medical problems. Family medical history. Current health, including: Emotional well-being. Home life and relationship well-being. Sexual activity. Lifestyle, including: Alcohol, nicotine or tobacco, and drug use. Access to firearms. Diet, exercise, and sleep habits. Safety issues such as seatbelt and bike helmet use. Sunscreen use. Work and work environment. Physical exam Your health care provider will check your: Height and weight. These may be used to calculate your BMI (body mass index). BMI is a measurement that tells if you are at a healthy weight. Waist circumference. This measures the distance around your waistline. This measurement also tells if you are at a healthy weight and may help predict your risk of certain diseases, such as type 2 diabetes and high blood pressure. Heart rate and blood pressure. Body temperature. Skin for abnormal spots. What immunizations do I need?  Vaccines are usually given at various ages, according to a schedule. Your health care provider will recommend vaccines for you based on your age, medical history, and lifestyle or other factors, such as travel or where you work. What tests do I need? Screening Your health care provider may recommend screening tests for certain conditions. This may include: Lipid and cholesterol levels. Diabetes screening. This is done by checking your blood sugar (glucose) after you have not eaten for a while (fasting). Hepatitis B test. Hepatitis C test. HIV (human immunodeficiency virus) test. STI (sexually transmitted infection)  testing, if you are at risk. Lung cancer screening. Prostate cancer screening. Colorectal cancer screening. Talk with your health care provider about your test results, treatment options, and if necessary, the need for more tests. Follow these instructions at home: Eating and drinking  Eat a diet that includes fresh fruits and vegetables, whole grains, lean protein, and low-fat dairy products. Take vitamin and mineral supplements as recommended by your health care provider. Do not drink alcohol if your health care provider tells you not to drink. If you drink alcohol: Limit how much you have to 0-2 drinks a day. Know how much alcohol is in your drink. In the [place] one drink equals one 12 oz bottle of beer (355 mL), one 5 oz glass of wine (148 mL), or one 1 oz glass of hard liquor (44 mL). Lifestyle Brush your teeth every morning and night with fluoride toothpaste. Floss one time each day. Exercise for at least 30 minutes 5 or more days each week. Do not use any products that contain nicotine or tobacco. These products include cigarettes, chewing tobacco, and vaping devices, such as e-cigarettes. If you need help quitting, ask your health care provider. Do not use drugs. If you are sexually active, practice safe sex. Use a condom or other form of protection to prevent STIs. Take aspirin only as told by your health care provider. Make sure that you understand how much to take and what form to take. Work with your health care provider to find out whether it is safe and beneficial for you to take aspirin daily. Find healthy ways to manage stress, such as: Meditation, yoga, or listening to music. Journaling. Talking to a trusted person. Spending time with friends and family. Minimize exposure to UV radiation to reduce   your risk of skin cancer. Safety Always wear your seat belt while driving or riding in a vehicle. Do not drive: If you have been drinking alcohol. Do not ride with someone who  has been drinking. When you are tired or distracted. While texting. If you have been using any mind-altering substances or drugs. Wear a helmet and other protective equipment during sports activities. If you have firearms in your house, make sure you follow all gun safety procedures. What's next? Go to your health care provider once a year for an annual wellness visit. Ask your health care provider how often you should have your eyes and teeth checked. Stay up to date on all vaccines. This information is not intended to replace advice given to you by your health care provider. Make sure you discuss any questions you have with your health care provider. Document Revised: 05/12/2021 Document Reviewed: 05/12/2021 Elsevier Patient Education  2024 [employer]  

## 2023-05-11 NOTE — Assessment & Plan Note (Addendum)
Well adult Orders Placed This Encounter  Procedures   COMPLETE METABOLIC PANEL WITH GFR   CBC with Differential   Lipid Panel w/reflex Direct LDL   TSH   PSA  Screenings: per lab orders Immunizations:  Declines shingrix Anticipatory guidance/Risk factor reduction:  recommendations per AVS.

## 2023-05-12 LAB — CBC WITH DIFFERENTIAL/PLATELET
Absolute Monocytes: 554 cells/uL (ref 200–950)
Basophils Absolute: 62 cells/uL (ref 0–200)
Basophils Relative: 0.8 %
Eosinophils Absolute: 331 cells/uL (ref 15–500)
Eosinophils Relative: 4.3 %
HCT: 41.5 % (ref 38.5–50.0)
Hemoglobin: 14 g/dL (ref 13.2–17.1)
Lymphs Abs: 3896 cells/uL (ref 850–3900)
MCH: 32.3 pg (ref 27.0–33.0)
MCHC: 33.7 g/dL (ref 32.0–36.0)
MCV: 95.6 fL (ref 80.0–100.0)
MPV: 10 fL (ref 7.5–12.5)
Monocytes Relative: 7.2 %
Neutro Abs: 2857 cells/uL (ref 1500–7800)
Neutrophils Relative %: 37.1 %
Platelets: 260 10*3/uL (ref 140–400)
RBC: 4.34 10*6/uL (ref 4.20–5.80)
RDW: 11.8 % (ref 11.0–15.0)
Total Lymphocyte: 50.6 %
WBC: 7.7 10*3/uL (ref 3.8–10.8)

## 2023-05-12 LAB — PSA: PSA: 0.45 ng/mL (ref <=4.00)

## 2023-05-12 LAB — COMPLETE METABOLIC PANEL WITH GFR
AG Ratio: 1.3 (calc) (ref 1.0–2.5)
ALT: 43 U/L (ref 9–46)
AST: 32 U/L (ref 10–35)
Albumin: 4.1 g/dL (ref 3.6–5.1)
Alkaline phosphatase (APISO): 84 U/L (ref 35–144)
BUN: 12 mg/dL (ref 7–25)
CO2: 28 mmol/L (ref 20–32)
Calcium: 9.6 mg/dL (ref 8.6–10.3)
Chloride: 103 mmol/L (ref 98–110)
Creat: 0.99 mg/dL (ref 0.70–1.35)
Globulin: 3.2 g/dL (calc) (ref 1.9–3.7)
Glucose, Bld: 91 mg/dL (ref 65–99)
Potassium: 4.3 mmol/L (ref 3.5–5.3)
Sodium: 138 mmol/L (ref 135–146)
Total Bilirubin: 0.4 mg/dL (ref 0.2–1.2)
Total Protein: 7.3 g/dL (ref 6.1–8.1)
eGFR: 85 mL/min/{1.73_m2} (ref >=60)

## 2023-05-12 LAB — LIPID PANEL W/REFLEX DIRECT LDL
Cholesterol: 159 mg/dL (ref <200)
HDL: 45 mg/dL (ref >=40)
LDL Cholesterol (Calc): 88 mg/dL (calc)
Non-HDL Cholesterol (Calc): 114 mg/dL (calc) (ref <130)
Total CHOL/HDL Ratio: 3.5 (calc) (ref <5.0)
Triglycerides: 159 mg/dL — ABNORMAL HIGH (ref <150)

## 2023-05-12 LAB — TSH: TSH: 1.57 mIU/L (ref 0.40–4.50)

## 2023-07-22 DIAGNOSIS — E785 Hyperlipidemia, unspecified: Secondary | ICD-10-CM | POA: Diagnosis not present

## 2023-07-22 DIAGNOSIS — I10 Essential (primary) hypertension: Secondary | ICD-10-CM | POA: Diagnosis not present

## 2023-07-22 DIAGNOSIS — Z6834 Body mass index (BMI) 34.0-34.9, adult: Secondary | ICD-10-CM | POA: Diagnosis not present

## 2023-07-22 DIAGNOSIS — E669 Obesity, unspecified: Secondary | ICD-10-CM | POA: Diagnosis not present

## 2023-07-22 DIAGNOSIS — M109 Gout, unspecified: Secondary | ICD-10-CM | POA: Diagnosis not present

## 2023-07-22 DIAGNOSIS — Z7982 Long term (current) use of aspirin: Secondary | ICD-10-CM | POA: Diagnosis not present

## 2023-07-27 ENCOUNTER — Other Ambulatory Visit: Payer: Self-pay | Admitting: Family Medicine

## 2023-07-27 DIAGNOSIS — M1000 Idiopathic gout, unspecified site: Secondary | ICD-10-CM

## 2023-07-27 DIAGNOSIS — E782 Mixed hyperlipidemia: Secondary | ICD-10-CM

## 2023-11-10 ENCOUNTER — Ambulatory Visit (INDEPENDENT_AMBULATORY_CARE_PROVIDER_SITE_OTHER): Payer: Medicare HMO | Admitting: Family Medicine

## 2023-11-10 ENCOUNTER — Encounter: Payer: Self-pay | Admitting: Family Medicine

## 2023-11-10 VITALS — BP 135/83 | HR 71 | Ht 72.0 in | Wt 256.0 lb

## 2023-11-10 DIAGNOSIS — M1000 Idiopathic gout, unspecified site: Secondary | ICD-10-CM | POA: Diagnosis not present

## 2023-11-10 DIAGNOSIS — I10 Essential (primary) hypertension: Secondary | ICD-10-CM | POA: Diagnosis not present

## 2023-11-10 DIAGNOSIS — E782 Mixed hyperlipidemia: Secondary | ICD-10-CM | POA: Diagnosis not present

## 2023-11-10 MED ORDER — ALLOPURINOL 300 MG PO TABS: 300.0000 mg | ORAL_TABLET | Freq: Every day | ORAL | 3 refills | Status: DC

## 2023-11-10 MED ORDER — PRAVASTATIN SODIUM 80 MG PO TABS: 80.0000 mg | ORAL_TABLET | Freq: Every day | ORAL | 3 refills | Status: DC

## 2023-11-10 MED ORDER — LISINOPRIL-HYDROCHLOROTHIAZIDE 20-25 MG PO TABS: 1.0000 | ORAL_TABLET | Freq: Every day | ORAL | 3 refills | Status: DC

## 2023-11-10 NOTE — Assessment & Plan Note (Signed)
BP is well controlled with current medication.  Continue lisinopril/hctz at current strength.
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
## 2023-11-10 NOTE — Assessment & Plan Note (Signed)
Denies recent gout flares.  Continue allopurinol at current strength.

## 2023-11-10 NOTE — Assessment & Plan Note (Signed)
Lab Results  Component Value Date   LDLCALC 88 05/11/2023  Continue pravastatin at current strength.

## 2023-12-28 ENCOUNTER — Other Ambulatory Visit: Payer: Self-pay | Admitting: Family Medicine

## 2024-05-10 ENCOUNTER — Ambulatory Visit (INDEPENDENT_AMBULATORY_CARE_PROVIDER_SITE_OTHER): Payer: Medicare HMO | Admitting: Family Medicine

## 2024-05-10 ENCOUNTER — Encounter: Payer: Self-pay | Admitting: Family Medicine

## 2024-05-10 VITALS — BP 146/80 | HR 70 | Ht 72.0 in | Wt 257.0 lb

## 2024-05-10 DIAGNOSIS — Z0000 Encounter for general adult medical examination without abnormal findings: Secondary | ICD-10-CM | POA: Diagnosis not present

## 2024-05-10 DIAGNOSIS — I10 Essential (primary) hypertension: Secondary | ICD-10-CM

## 2024-05-10 DIAGNOSIS — Z125 Encounter for screening for malignant neoplasm of prostate: Secondary | ICD-10-CM

## 2024-05-10 DIAGNOSIS — E782 Mixed hyperlipidemia: Secondary | ICD-10-CM

## 2024-05-10 NOTE — Progress Notes (Signed)
 Brandon Sandoval - 68 y.o. male MRN [id number]  Date of birth: [birth year]  Subjective Chief Complaint  Patient presents with   Annual Exam    HPI Brandon Sandoval is a 68 y.o. male here today for annual exam.   He reports that he is doing well. He is doing well with current medications.    He is moderately active.  He feels that his diet is pretty good.  He is a non-smoker.  Denies EtOH.  Review of Systems  Constitutional:  Negative for chills, fever, malaise/fatigue and weight loss.  HENT:  Negative for congestion, ear pain and sore throat.   Eyes:  Negative for blurred vision, double vision and pain.  Respiratory:  Negative for cough and shortness of breath.   Cardiovascular:  Negative for chest pain and palpitations.  Gastrointestinal:  Negative for abdominal pain, blood in stool, constipation, heartburn and nausea.  Genitourinary:  Negative for dysuria and urgency.  Musculoskeletal:  Negative for joint pain and myalgias.  Neurological:  Negative for dizziness and headaches.  Endo/Heme/Allergies:  Does not bruise/bleed easily.  Psychiatric/Behavioral:  Negative for depression. The patient is not nervous/anxious and does not have insomnia.       No Known Allergies  Past Medical History:  Diagnosis Date   Burn    RIGHT antecubital fossa   Gout    Hyperlipidemia    Hypertension    Hyperuricemia 04/19/2019   Renal insufficiency 04/19/2019    Past Surgical History:  Procedure Laterality Date   NO PAST SURGERIES      Social History   Socioeconomic History   Marital status: Married    Spouse name: [place]  S. Worthing   Number of children: 1   Years of education: 14   Highest education level: Not on file  Occupational History   Occupation: Truck Hospital doctor  Tobacco Use   Smoking status: Never   Smokeless tobacco: Never  Substance and Sexual Activity   Alcohol use: No    Alcohol/week: 0.0 standard drinks of alcohol   Drug use: No   Sexual activity: Yes  Other Topics  Concern   Not on file  Social History Narrative   Marital status: married x 1983      Children: 1 daughter; 1 stepdaughter; 3 grandchildren; no gg.      Lives: with wife, 1 daughter      Employment: truck Hospital doctor; IT trainer route only; previous school bus driver also      Education:Associate's Degree.      Tobacco: none      Alcohol: none      Exercise: none in 2018; maintains own lawn.      Seatbelt: 100%; no texting.    Social Drivers of Corporate investment banker Strain: Not on file  Food Insecurity: Not on file  Transportation Needs: Not on file  Physical Activity: Not on file  Stress: Not on file  Social Connections: Unknown (04/09/2022)   Received from [facility]   Social Network    Social Network: Not on file    Family History  Problem Relation Age of Onset   Diabetes Mother    Heart disease Mother    Heart disease Father        pacemaker   Hypertension Father    Kidney failure Father    Heart disease Brother    Lung disease Cousin    Prostate cancer Neg Hx    Colon cancer Neg Hx     Health Maintenance  Topic Date Due   Medicare Annual Wellness (AWV)  Never done   Pneumococcal Vaccine: 50+ Years (1 of 1 - PCV) Never done   Zoster Vaccines- Shingrix (1 of 2) Never done   COVID-19 Vaccine (4 - 2024-25 season) 07/30/2023   INFLUENZA VACCINE  06/28/2024   Colonoscopy  06/05/2029   DTaP/Tdap/Td (3 - Td or Tdap) 05/12/2032   Hepatitis C Screening  Completed   HIV Screening  Completed   HPV VACCINES  Aged Out   Meningococcal B Vaccine  Aged Out     ----------------------------------------------------------------------------------------------------------------------------------------------------------------------------------------------------------------- Physical Exam BP (!) 146/80 (BP Location: Left Arm, Patient Position: Sitting, Cuff Size: Large)   Pulse 70   Ht 6' (1.829 m)   Wt 257 lb (116.6 kg)   SpO2 96%   BMI 34.86 kg/m   Physical  Exam Constitutional:      General: He is not in acute distress. HENT:     Head: Normocephalic and atraumatic.     Right Ear: Tympanic membrane and external ear normal.     Left Ear: Tympanic membrane and external ear normal.   Eyes:     General: No scleral icterus.  Neck:     Thyroid: No thyromegaly.   Cardiovascular:     Rate and Rhythm: Normal rate and regular rhythm.     Heart sounds: Normal heart sounds.  Pulmonary:     Effort: Pulmonary effort is normal.     Breath sounds: Normal breath sounds.  Abdominal:     General: Bowel sounds are normal. There is no distension.     Palpations: Abdomen is soft.     Tenderness: There is no abdominal tenderness. There is no guarding.   Musculoskeletal:     Cervical back: Normal range of motion.  Lymphadenopathy:     Cervical: No cervical adenopathy.   Skin:    General: Skin is warm and dry.     Findings: No rash.   Neurological:     Mental Status: He is alert and oriented to person, place, and time.     Cranial Nerves: No cranial nerve deficit.     Motor: No abnormal muscle tone.   Psychiatric:        Mood and Affect: Mood normal.        Behavior: Behavior normal.     ------------------------------------------------------------------------------------------------------------------------------------------------------------------------------------------------------------------- Assessment and Plan  Well adult exam Well adult Orders Placed This Encounter  Procedures   PSA   TSH   Lipid panel   CBC with Differential/Platelet   CMP14+EGFR  Screenings: per lab orders Immunizations:  Declines shingrix Anticipatory guidance/Risk factor reduction:  recommendations per AVS.    No orders of the defined types were placed in this encounter.   No follow-ups on file.

## 2024-05-10 NOTE — Assessment & Plan Note (Addendum)
 Well adult Orders Placed This Encounter  Procedures   PSA   TSH   Lipid panel   CBC with Differential/Platelet   CMP14+EGFR  Screenings: per lab orders Immunizations:  Declines shingrix Anticipatory guidance/Risk factor reduction:  recommendations per AVS.

## 2024-05-10 NOTE — Patient Instructions (Signed)

## 2024-05-11 LAB — CMP14+EGFR
ALT: 43 IU/L (ref 0–44)
AST: 38 IU/L (ref 0–40)
Albumin: 4.2 g/dL (ref 3.9–4.9)
Alkaline Phosphatase: 92 IU/L (ref 44–121)
BUN/Creatinine Ratio: 13 (ref 10–24)
BUN: 13 mg/dL (ref 8–27)
Bilirubin Total: 0.6 mg/dL (ref 0.0–1.2)
CO2: 22 mmol/L (ref 20–29)
Calcium: 9.3 mg/dL (ref 8.6–10.2)
Chloride: 102 mmol/L (ref 96–106)
Creatinine, Ser: 1.03 mg/dL (ref 0.76–1.27)
Globulin, Total: 3.1 g/dL (ref 1.5–4.5)
Glucose: 87 mg/dL (ref 70–99)
Potassium: 3.8 mmol/L (ref 3.5–5.2)
Sodium: 139 mmol/L (ref 134–144)
Total Protein: 7.3 g/dL (ref 6.0–8.5)
eGFR: 81 mL/min/1.73

## 2024-05-11 LAB — CBC WITH DIFFERENTIAL/PLATELET
Basophils Absolute: 0.1 x10E3/uL (ref 0.0–0.2)
Basos: 1 %
EOS (ABSOLUTE): 0.3 x10E3/uL (ref 0.0–0.4)
Eos: 4 %
Hematocrit: 41.7 % (ref 37.5–51.0)
Hemoglobin: 13.6 g/dL (ref 13.0–17.7)
Immature Grans (Abs): 0.1 x10E3/uL (ref 0.0–0.1)
Immature Granulocytes: 1 %
Lymphocytes Absolute: 3.2 x10E3/uL — ABNORMAL HIGH (ref 0.7–3.1)
Lymphs: 43 %
MCH: 32.4 pg (ref 26.6–33.0)
MCHC: 32.6 g/dL (ref 31.5–35.7)
MCV: 99 fL — ABNORMAL HIGH (ref 79–97)
Monocytes Absolute: 0.6 x10E3/uL (ref 0.1–0.9)
Monocytes: 9 %
Neutrophils Absolute: 3 x10E3/uL (ref 1.4–7.0)
Neutrophils: 42 %
Platelets: 256 x10E3/uL (ref 150–450)
RBC: 4.2 x10E6/uL (ref 4.14–5.80)
RDW: 12.3 % (ref 11.6–15.4)
WBC: 7.2 x10E3/uL (ref 3.4–10.8)

## 2024-05-11 LAB — PSA: Prostate Specific Ag, Serum: 0.5 ng/mL (ref 0.0–4.0)

## 2024-05-11 LAB — LIPID PANEL
Chol/HDL Ratio: 3.7 ratio (ref 0.0–5.0)
Cholesterol, Total: 175 mg/dL (ref 100–199)
HDL: 47 mg/dL (ref >39)
LDL Chol Calc (NIH): 99 mg/dL (ref 0–99)
Triglycerides: 166 mg/dL — ABNORMAL HIGH (ref 0–149)
VLDL Cholesterol Cal: 29 mg/dL (ref 5–40)

## 2024-05-11 LAB — TSH: TSH: 1.23 u[IU]/mL (ref 0.450–4.500)

## 2024-05-17 ENCOUNTER — Ambulatory Visit: Payer: Self-pay | Admitting: Family Medicine

## 2024-07-12 ENCOUNTER — Other Ambulatory Visit: Payer: Self-pay | Admitting: Family Medicine

## 2024-07-12 DIAGNOSIS — M1000 Idiopathic gout, unspecified site: Secondary | ICD-10-CM

## 2024-07-12 DIAGNOSIS — E782 Mixed hyperlipidemia: Secondary | ICD-10-CM

## 2024-08-27 NOTE — Progress Notes (Signed)
 CORNELUIS ALLSTON                                          MRN: [id number]   08/27/2024   The [facility] Quality Team Specialist reviewed this patient medical record for the purposes of chart review for care gap closure. The following were reviewed: chart review for care gap closure-controlling blood pressure.    [facility] Quality Team

## 2024-09-12 NOTE — Progress Notes (Signed)
 [name]                                          MRN: [id number]   09/12/2024   The [facility] Quality Team Specialist reviewed this patient medical record for the purposes of chart review for care gap closure. The following were reviewed: chart review for care gap closure-controlling blood pressure.    [facility] Quality Team

## 2024-10-11 NOTE — Progress Notes (Signed)
 Brandon Sandoval                                          MRN: [id number]   10/11/2024   The [facility] Quality Team Specialist reviewed this patient medical record for the purposes of chart review for care gap closure. The following were reviewed: chart review for care gap closure-controlling blood pressure.    [facility] Quality Team

## 2024-11-07 NOTE — Progress Notes (Signed)
 CARDER YIN                                          MRN: [id number]   11/07/2024   The [facility] Quality Team Specialist reviewed this patient medical record for the purposes of chart review for care gap closure. The following were reviewed: chart review for care gap closure-controlling blood pressure.    [facility] Quality Team

## 2024-11-08 ENCOUNTER — Ambulatory Visit: Admitting: Family Medicine

## 2024-11-08 ENCOUNTER — Encounter: Payer: Self-pay | Admitting: Family Medicine

## 2024-11-08 VITALS — BP 116/75 | HR 75 | Ht 72.0 in | Wt 261.0 lb

## 2024-11-08 DIAGNOSIS — E790 Hyperuricemia without signs of inflammatory arthritis and tophaceous disease: Secondary | ICD-10-CM | POA: Diagnosis not present

## 2024-11-08 DIAGNOSIS — I10 Essential (primary) hypertension: Secondary | ICD-10-CM | POA: Diagnosis not present

## 2024-11-08 DIAGNOSIS — E782 Mixed hyperlipidemia: Secondary | ICD-10-CM | POA: Diagnosis not present

## 2024-11-08 DIAGNOSIS — M1000 Idiopathic gout, unspecified site: Secondary | ICD-10-CM

## 2024-11-08 MED ORDER — PRAVASTATIN SODIUM 80 MG PO TABS: 80.0000 mg | ORAL_TABLET | Freq: Every day | ORAL | 3 refills | Status: AC

## 2024-11-08 MED ORDER — ALLOPURINOL 300 MG PO TABS: 300.0000 mg | ORAL_TABLET | Freq: Every day | ORAL | 3 refills | Status: AC

## 2024-11-08 MED ORDER — LISINOPRIL-HYDROCHLOROTHIAZIDE 20-25 MG PO TABS: 1.0000 | ORAL_TABLET | Freq: Every day | ORAL | 3 refills | Status: AC

## 2024-11-08 NOTE — Progress Notes (Signed)
 Brandon Sandoval - 68 y.o. male MRN [id number]  Date of birth: May 22, 1956  Subjective Chief Complaint  Patient presents with   Hypertension    HPI Brandon Sandoval is a 68 y.o. male here today for follow up visit.   He reports that he is doing well.   He remains on lisinopril /hydrochlorothiazide  for management of HTN.  BP is well controlled.  He denies chest pain, shortness of breath, palpitations, headache or vision changes.   Tolerating pravastatin  well for HLD.   No recent gout flares.   ROS:  A comprehensive ROS was completed and negative except as noted per HPI  Allergies[1]  Past Medical History:  Diagnosis Date   Burn    RIGHT antecubital fossa   Gout    Hyperlipidemia    Hypertension    Hyperuricemia 04/19/2019   Renal insufficiency 04/19/2019    Past Surgical History:  Procedure Laterality Date   NO PAST SURGERIES      Social History   Socioeconomic History   Marital status: Married    Spouse name: [place]  CANDIE Butter   Number of children: 1   Years of education: 14   Highest education level: Not on file  Occupational History   Occupation: Truck Hospital Doctor  Tobacco Use   Smoking status: Never   Smokeless tobacco: Never  Substance and Sexual Activity   Alcohol use: No    Alcohol/week: 0.0 standard drinks of alcohol   Drug use: No   Sexual activity: Yes  Other Topics Concern   Not on file  Social History Narrative   Marital status: married x 1983      Children: 1 daughter; 1 stepdaughter; 3 grandchildren; no gg.      Lives: with wife, 1 daughter      Employment: truck hospital doctor; It Trainer route only; previous school bus driver also      Education:Associate's Degree.      Tobacco: none      Alcohol: none      Exercise: none in 2018; maintains own lawn.      Seatbelt: 100%; no texting.    Social Drivers of Health   Tobacco Use: Low Risk (11/08/2024)   Patient History    Smoking Tobacco Use: Never    Smokeless Tobacco Use: Never    Passive Exposure: Not  on file  Financial Resource Strain: Low Risk (05/10/2024)   Overall Financial Resource Strain (CARDIA)    Difficulty of Paying Living Expenses: Not hard at all  Food Insecurity: No Food Insecurity (05/10/2024)   Epic    Worried About Programme Researcher, Broadcasting/film/video in the Last Year: Never true    Ran Out of Food in the Last Year: Never true  Transportation Needs: No Transportation Needs (05/10/2024)   Epic    Lack of Transportation (Medical): No    Lack of Transportation (Non-Medical): No  Physical Activity: Sufficiently Active (05/10/2024)   Exercise Vital Sign    Days of Exercise per Week: 5 days    Minutes of Exercise per Session: 60 min  Stress: No Stress Concern Present (05/10/2024)   [name]    Feeling of Stress: Not at all  Social Connections: Socially Integrated (05/10/2024)   Social Connection and Isolation Panel    Frequency of Communication with Friends and Family: Twice a week    Frequency of Social Gatherings with Friends and Family: Twice a week    Attends Religious Services: 1 to 4 times per year  Active Member of Clubs or Organizations: Patient declined    Attends Banker Meetings: 1 to 4 times per year    Marital Status: Married  Depression (PHQ2-9): Low Risk (05/10/2024)   Depression (PHQ2-9)    PHQ-2 Score: 0  Alcohol Screen: Low Risk (05/10/2024)   Alcohol Screen    Last Alcohol Screening Score (AUDIT): 0  Housing: Low Risk (05/10/2024)   Epic    Unable to Pay for Housing in the Last Year: No    Number of Times Moved in the Last Year: 0    Homeless in the Last Year: No  Utilities: Not At Risk (05/10/2024)   Epic    Threatened with loss of utilities: No  Health Literacy: Adequate Health Literacy (05/10/2024)   B1300 Health Literacy    Frequency of need for help with medical instructions: Rarely    Family History  Problem Relation Age of Onset   Diabetes Mother    Heart disease Mother     Heart disease Father        pacemaker   Hypertension Father    Kidney failure Father    Heart disease Brother    Lung disease Cousin    Prostate cancer Neg Hx    Colon cancer Neg Hx     Health Maintenance  Topic Date Due   Medicare Annual Wellness (AWV)  Never done   Zoster Vaccines- Shingrix (1 of 2) 02/06/2025 (Originally 06/04/2008)   Influenza Vaccine  02/25/2025 (Originally 06/28/2024)   Pneumococcal Vaccine: 50+ Years (1 of 1 - PCV) 11/08/2025 (Originally 06/04/2008)   COVID-19 Vaccine (4 - 2025-26 season) 11/24/2025 (Originally 07/29/2024)   Colonoscopy  06/05/2029   DTaP/Tdap/Td (3 - Td or Tdap) 05/12/2032   Hepatitis C Screening  Completed   Meningococcal B Vaccine  Aged Out     ----------------------------------------------------------------------------------------------------------------------------------------------------------------------------------------------------------------- Physical Exam BP 116/75 (BP Location: Left Arm, Patient Position: Sitting, Cuff Size: Large)   Pulse 75   Ht 6' (1.829 m)   Wt 261 lb (118.4 kg)   SpO2 95%   BMI 35.40 kg/m   Physical Exam Constitutional:      Appearance: Normal appearance.  Cardiovascular:     Rate and Rhythm: Normal rate and regular rhythm.  Pulmonary:     Effort: Pulmonary effort is normal.     Breath sounds: Normal breath sounds.  Musculoskeletal:     Cervical back: Neck supple.  Neurological:     Mental Status: He is alert.     ------------------------------------------------------------------------------------------------------------------------------------------------------------------------------------------------------------------- Assessment and Plan  Hyperuricemia Doing well with allopurinol .  No recent gout flares.  Continue at current strength.   Mixed hyperlipidemia Lab Results  Component Value Date   LDLCALC 99 05/10/2024  Continue pravastatin  at current strength.     HTN (hypertension) BP  is well controlled with current medication.  Continue lisinopril /hctz at current strength.    Meds ordered this encounter  Medications   allopurinol  (ZYLOPRIM ) 300 MG tablet    Sig: Take 1 tablet (300 mg total) by mouth daily.    Dispense:  90 tablet    Refill:  3   lisinopril -hydrochlorothiazide  (ZESTORETIC ) 20-25 MG tablet    Sig: Take 1 tablet by mouth daily.    Dispense:  90 tablet    Refill:  3    Rx was sent on 11/10/23 for 90 tabs with 3 refills   pravastatin  (PRAVACHOL ) 80 MG tablet    Sig: Take 1 tablet (80 mg total) by mouth daily.    Dispense:  90 tablet    Refill:  3    Return in about 6 months (around 05/09/2025) for Annual Exam, Hypertension.        [1] No Known Allergies

## 2024-11-08 NOTE — Assessment & Plan Note (Signed)
BP is well controlled with current medication.  Continue lisinopril/hctz at current strength.

## 2024-11-08 NOTE — Assessment & Plan Note (Signed)
Doing well with allopurinol.  No recent gout flares.  Continue at current strength.

## 2024-11-08 NOTE — Assessment & Plan Note (Signed)
 Lab Results  Component Value Date   LDLCALC 99 05/10/2024  Continue pravastatin  at current strength.

## 2024-11-14 NOTE — Progress Notes (Signed)
 Brandon Sandoval                                          MRN: [id number]   11/14/2024   The [facility] Quality Team Specialist reviewed this patient medical record for the purposes of chart review for care gap closure. The following were reviewed: abstraction for care gap closure-controlling blood pressure.    [facility] Quality Team

## 2024-11-19 NOTE — Progress Notes (Signed)
 AURELIUS GILDERSLEEVE                                          MRN: [id number]   11/19/2024   The [facility] Quality Team Specialist reviewed this patient medical record for the purposes of chart review for care gap closure. The following were reviewed: erroneous encounter.    [facility]BCI Quality Team

## 2024-12-21 ENCOUNTER — Other Ambulatory Visit: Payer: Self-pay | Admitting: Family Medicine

## 2025-05-12 ENCOUNTER — Encounter: Admitting: Family Medicine
